# Patient Record
Sex: Male | Born: 1990 | Race: Black or African American | Hispanic: No | Marital: Single | Smoking: Current every day smoker
Health system: Southern US, Community
[De-identification: ages and names within clinical notes are randomized; demographics above are authoritative.]

## PROBLEM LIST (undated history)

## (undated) DIAGNOSIS — I10 Essential (primary) hypertension: Secondary | ICD-10-CM

---
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
## 2008-04-09 ENCOUNTER — Encounter: Payer: Self-pay | Admitting: Family Medicine

## 2008-04-29 ENCOUNTER — Encounter: Payer: Self-pay | Admitting: Family Medicine

## 2008-06-04 ENCOUNTER — Encounter: Payer: Self-pay | Admitting: Family Medicine

## 2009-02-10 ENCOUNTER — Encounter: Payer: Self-pay | Admitting: Family Medicine

## 2009-02-10 ENCOUNTER — Ambulatory Visit: Payer: Self-pay | Admitting: Family Medicine

## 2009-02-10 DIAGNOSIS — D573 Sickle-cell trait: Secondary | ICD-10-CM | POA: Insufficient documentation

## 2009-02-10 LAB — CONVERTED CEMR LAB
ALT: 9 units/L (ref 0–53)
AST: 12 units/L (ref 0–37)
Albumin: 4.8 g/dL (ref 3.5–5.2)
Alkaline Phosphatase: 65 units/L (ref 39–117)
BUN: 14 mg/dL (ref 6–23)
CO2: 24 meq/L (ref 19–32)
Calcium: 9.6 mg/dL (ref 8.4–10.5)
Chloride: 105 meq/L (ref 96–112)
Creatinine, Ser: 1.24 mg/dL (ref 0.40–1.50)
Glucose, Bld: 95 mg/dL (ref 70–99)
HCT: 43.9 % (ref 39.0–52.0)
Hemoglobin: 15.5 g/dL (ref 13.0–17.0)
MCHC: 35.3 g/dL (ref 30.0–36.0)
MCV: 84.1 fL (ref 78.0–100.0)
Platelets: 155 10*3/uL (ref 150–400)
Potassium: 4.8 meq/L (ref 3.5–5.3)
RBC: 5.22 M/uL (ref 4.22–5.81)
RDW: 15 % (ref 11.5–15.5)
Sodium: 141 meq/L (ref 135–145)
Total Bilirubin: 0.7 mg/dL (ref 0.3–1.2)
Total Protein: 8.1 g/dL (ref 6.0–8.3)
WBC: 4.3 10*3/uL (ref 4.0–10.5)

## 2009-02-16 ENCOUNTER — Encounter (INDEPENDENT_AMBULATORY_CARE_PROVIDER_SITE_OTHER): Payer: Self-pay | Admitting: *Deleted

## 2009-02-20 ENCOUNTER — Ambulatory Visit: Payer: Self-pay | Admitting: Family Medicine

## 2009-05-03 ENCOUNTER — Emergency Department (HOSPITAL_COMMUNITY): Admission: EM | Admit: 2009-05-03 | Discharge: 2009-05-03 | Payer: Self-pay | Admitting: Emergency Medicine

## 2009-08-15 ENCOUNTER — Encounter (INDEPENDENT_AMBULATORY_CARE_PROVIDER_SITE_OTHER): Payer: Self-pay | Admitting: *Deleted

## 2009-08-15 DIAGNOSIS — F17200 Nicotine dependence, unspecified, uncomplicated: Secondary | ICD-10-CM | POA: Insufficient documentation

## 2010-03-29 ENCOUNTER — Ambulatory Visit: Payer: Self-pay | Admitting: Family Medicine

## 2010-03-29 ENCOUNTER — Encounter: Payer: Self-pay | Admitting: Family Medicine

## 2010-03-29 ENCOUNTER — Telehealth: Payer: Self-pay | Admitting: Family Medicine

## 2010-03-29 DIAGNOSIS — J029 Acute pharyngitis, unspecified: Secondary | ICD-10-CM

## 2010-03-29 LAB — CONVERTED CEMR LAB: Rapid Strep: NEGATIVE

## 2010-03-30 ENCOUNTER — Encounter: Payer: Self-pay | Admitting: Family Medicine

## 2010-12-08 ENCOUNTER — Emergency Department (HOSPITAL_COMMUNITY)
Admission: EM | Admit: 2010-12-08 | Discharge: 2010-12-08 | Payer: Self-pay | Source: Home / Self Care | Admitting: Emergency Medicine

## 2010-12-27 ENCOUNTER — Ambulatory Visit
Admission: RE | Admit: 2010-12-27 | Discharge: 2010-12-27 | Payer: Self-pay | Source: Home / Self Care | Attending: Family Medicine | Admitting: Family Medicine

## 2010-12-28 ENCOUNTER — Ambulatory Visit: Admission: RE | Admit: 2010-12-28 | Payer: Self-pay | Source: Home / Self Care

## 2010-12-28 ENCOUNTER — Telehealth: Payer: Self-pay | Admitting: *Deleted

## 2010-12-28 NOTE — Letter (Signed)
Summary: Generic Letter  [facility]  [address]   [place], [place] [address]   Phone: [phone]  Fax: [phone]    03/30/2010  Walter Salinas [address] [place], [place]  [address]  Dear Mr. Deloria,   Your HIV test was negative    Sincerely,   Angelena Sole MD  Appended Document: Generic Letter mailed.

## 2010-12-28 NOTE — Assessment & Plan Note (Signed)
Summary: sore throat/[place]   Vital Signs:  Patient profile:   20 year old male Height:      71 inches Weight:      157 pounds BMI:     21.98 BSA:     1.90 Temp:     98.1 degrees F Pulse rate:   87 / minute BP sitting:   151 / 102  Vitals Entered By: Jone Baseman CMA (Mar 29, 2010 1:58 PM)  Serial Vital Signs/Assessments:  Time      Position  BP       Pulse  Resp  Temp     By                     140/100                        Angelena Sole MD  CC: sore throat x 1 day Is Patient Diabetic? No Pain Assessment Patient in pain? yes     Location: throat Intensity: 8   Primary Care Provider:  Neena Rhymes  MD  CC:  sore throat x 1 day.  History of Present Illness: 1. Sore throat:  Pt has had a sore throat since Saturday.  It came on all of a sudden after he was having sex with a girl.  It feels like a tickle in his throat.  It is associated with a cough.  It is improved with cough syrup.    ROS: denies fevers, lymphadenopathy, abdominal pain, shortness of breath.  SocHx: Does do oral sex.  Uses a condom during intercourse.  Continues to smoke tobacco.  2. HTN: pt has not been taking his Norvasc.  Hasn't taken anything in years.  He doesn't check his blood pressure at home regularly.  ROS: endorses occ dizziness.  Denies chest pain, headache.  Habits & Providers  Alcohol-Tobacco-Diet     Tobacco Status: current     Tobacco Counseling: to quit use of tobacco products     Cigarette Packs/Day: 1.0  Current Medications (verified): 1)  Ibuprofen 600 Mg  Tabs (Ibuprofen) .Marland Kitchen.. 1 Tablet Every 6 To 8 Hours Daiy Prn (No More Than 4 Tablets in 24 Hours) 2)  Zyrtec Allergy 10 Mg  Tabs (Cetirizine Hcl) .Marland Kitchen.. 1 Once Daily Prn 3)  Flonase 50 Mcg/act Susp (Fluticasone Propionate) .... 2 Sprays Per Nostril Daily. 4)  Lisinopril-Hydrochlorothiazide 10-12.5 Mg Tabs (Lisinopril-Hydrochlorothiazide) .... Take 1 Tab By Mouth Daily  Allergies: No Known Drug Allergies  Past  History:  Past Medical History: Reviewed history from 10/31/2007 and no changes required. 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 mom: asthma father: Hypertension, DMII, on dialysis, some cancer  Social History: Reviewed history from 02/10/2009 and no changes required. lives with mom and older sister, favors fried food - high salt diet.  current smoker.Packs/Day:  1.0  Physical Exam  General:  alert, well-developed, and well-hydrated.   Head:  normocephalic and atraumatic.   Eyes:  vision grossly intact.   Mouth:  poor dentition, pharynx red and moist.  No definite exudate but poor visibility.   Neck:  anterior cervical lymphadenopathy Lungs:  normal respiratory effort and normal breath sounds.  Occassional expiratory wheezes.  No crackles.   Heart:  normal rate, regular rhythm, and no murmur.   Abdomen:  soft, non-tender, normal bowel sounds, and no distention.   Msk:  no  joint tenderness and no joint swelling.   Extremities:  no lower extremity edema Skin:  turgor normal, no rashes, and no suspicious lesions.     Impression & Recommendations:  Problem # 1:  SORE THROAT (ICD-462) Assessment New Likely viral.  Rapid strep negative.  He does have some risk factors for GC pharyngitis or mono.  If symptoms persist would check for these. His updated medication list for this problem includes:    Ibuprofen 600 Mg Tabs (Ibuprofen) .Marland Kitchen... 1 tablet every 6 to 8 hours daiy prn (no more than 4 tablets in 24 hours)  Orders: Rapid Strep-[facility] (62130) [facility]- Est Level  3 (86578)  Problem # 2:  HYPERTENSION, BENIGN SYSTEMIC (ICD-401.1) Assessment: Deteriorated  Not taking his medications.  Encouraged him about the importance of him taking his medicine.  Wills start ACEI-HCTZ. The following medications were removed from the medication list:    Norvasc 5 Mg Tabs  (Amlodipine besylate) .Marland Kitchen... 1 tab by mouth daily His updated medication list for this problem includes:    Lisinopril-hydrochlorothiazide 10-12.5 Mg Tabs (Lisinopril-hydrochlorothiazide) .Marland Kitchen... Take 1 tab by mouth daily  Orders: [facility]- Est Level  3 (46962)  Complete Medication List: 1)  Ibuprofen 600 Mg Tabs (Ibuprofen) .Marland Kitchen.. 1 tablet every 6 to 8 hours daiy prn (no more than 4 tablets in 24 hours) 2)  Zyrtec Allergy 10 Mg Tabs (Cetirizine hcl) .Marland Kitchen.. 1 once daily prn 3)  Flonase 50 Mcg/act Susp (Fluticasone propionate) .... 2 sprays per nostril daily. 4)  Lisinopril-hydrochlorothiazide 10-12.5 Mg Tabs (Lisinopril-hydrochlorothiazide) .... Take 1 tab by mouth daily  Other Orders: HIV-[facility] ([id number])  Patient Instructions: 1)  We are going to check some labs today 2)  I think that your sore throat is caused by a virus 3)  It should get better in 7-10 days 4)  If not better by then please return to clinic 5)  If you develop fevers, difficulty swallowing or shortness of breath please return to clinic. 6)  I have sent a prescription for a blood pressure medicine to the pharmacy. 7)  It is important for you to take this to prevent a heart attack or stroke 8)  Please schedule a follow up appointment with me in 6-8 weeks Prescriptions: LISINOPRIL-HYDROCHLOROTHIAZIDE 10-12.5 MG TABS (LISINOPRIL-HYDROCHLOROTHIAZIDE) Take 1 tab by mouth daily  #30 x 3   Entered and Authorized by:   Angelena Sole MD   Signed by:   Angelena Sole MD on 03/29/2010   Method used:   Electronically to        [id number]       [address]       [place], [place]  [address]       Ph: [phone] or [phone]       Fax: [phone]   RxID:   [id number]   Laboratory Results  Date/Time Received: Mar 29, 2010 2:19 PM  Date/Time Reported: Mar 29, 2010 2:44 PM   Other Tests  Rapid Strep: negative Comments: ...........test performed by...........Marland KitchenTerese Door,  CMA     Prevention & Chronic Care Immunizations   Influenza vaccine: Not documented    Tetanus booster: Not documented    Pneumococcal vaccine: Not documented  Other Screening   Smoking status: current  (03/29/2010)   Smoking cessation counseling: yes  (03/29/2010)  Hypertension   Last Blood Pressure: 151 / 102  (03/29/2010)   Serum creatinine: 1.24  (02/10/2009)   Serum potassium 4.8  (  02/10/2009)    Hypertension flowsheet reviewed?: Yes   Progress toward BP goal: Deteriorated  Self-Management Support :   Personal Goals (by the next clinic visit) :      Personal blood pressure goal: 140/90  (03/29/2010)   Hypertension self-management support: Not documented

## 2010-12-28 NOTE — Progress Notes (Signed)
Summary: triage  Phone Note Call from Patient Call back at [phone]   Caller: Adrienne Mocha Summary of Call: Pt has sore throat and wondering if he can be seen today. Initial call taken by: Clydell Hakim,  Mar 29, 2010 10:19 AM  Follow-up for Phone Call        she will bring him at 1:30 & will see Dr. Lelon Perla Follow-up by: Golden Circle RN,  Mar 29, 2010 10:22 AM

## 2010-12-30 ENCOUNTER — Emergency Department (HOSPITAL_COMMUNITY)
Admission: EM | Admit: 2010-12-30 | Discharge: 2010-12-30 | Disposition: A | Discharge status: Home / Self Care | Payer: Self-pay | Attending: Emergency Medicine | Admitting: Emergency Medicine

## 2010-12-30 DIAGNOSIS — I10 Essential (primary) hypertension: Secondary | ICD-10-CM | POA: Insufficient documentation

## 2010-12-30 DIAGNOSIS — N342 Other urethritis: Secondary | ICD-10-CM | POA: Insufficient documentation

## 2010-12-30 DIAGNOSIS — R300 Dysuria: Secondary | ICD-10-CM | POA: Insufficient documentation

## 2010-12-30 LAB — URINE MICROSCOPIC-ADD ON

## 2010-12-30 LAB — URINALYSIS, ROUTINE W REFLEX MICROSCOPIC
Bilirubin Urine: NEGATIVE
Hgb urine dipstick: NEGATIVE
Ketones, ur: NEGATIVE mg/dL
Nitrite: NEGATIVE
Protein, ur: NEGATIVE mg/dL
Specific Gravity, Urine: 1.018 (ref 1.005–1.030)
Urine Glucose, Fasting: NEGATIVE mg/dL
Urobilinogen, UA: 4 mg/dL — ABNORMAL HIGH (ref 0.0–1.0)
pH: 7.5 (ref 5.0–8.0)

## 2010-12-31 LAB — GC/CHLAMYDIA PROBE AMP, GENITAL
Chlamydia, DNA Probe: NEGATIVE
GC Probe Amp, Genital: NEGATIVE

## 2011-01-05 NOTE — Progress Notes (Signed)
Summary: Complaint  Phone Note Call from Patient Call back at [phone]   Caller: Amy/Mother Summary of Call: Mother requesting to speak with Director re: todays appt, pt ended up lvg without being seen.  Initial call taken by: Knox Royalty,  December 28, 2010 2:25 PM  Follow-up for Phone Call        Call was from his Aunt.  She described his concern regarding his office visit today.  Asked for his phone number so I could speak with him directly.   Follow-up by: Dennison Nancy RN,  December 28, 2010 4:22 PM  Additional Follow-up for Phone Call Additional follow up Details #1::        Spoke with patient and clarified the circumstances regarding his concerns.  I apologized and assured him I would follow up.  Asked him if he wanted me to call him back after speaking with the employee and he said "no" .   Additional Follow-up by: Dennison Nancy RN,  December 29, 2010 7:51 AM

## 2011-01-13 ENCOUNTER — Ambulatory Visit: Payer: Self-pay | Admitting: Family Medicine

## 2011-01-18 ENCOUNTER — Ambulatory Visit (INDEPENDENT_AMBULATORY_CARE_PROVIDER_SITE_OTHER): Payer: Self-pay | Admitting: Family Medicine

## 2011-01-18 ENCOUNTER — Other Ambulatory Visit: Payer: Self-pay | Admitting: Family Medicine

## 2011-01-18 ENCOUNTER — Encounter: Payer: Self-pay | Admitting: Family Medicine

## 2011-01-18 VITALS — BP 151/101 | HR 69 | Temp 97.9°F | Ht 70.0 in | Wt 164.3 lb

## 2011-01-18 DIAGNOSIS — Z202 Contact with and (suspected) exposure to infections with a predominantly sexual mode of transmission: Secondary | ICD-10-CM | POA: Insufficient documentation

## 2011-01-18 DIAGNOSIS — B86 Scabies: Secondary | ICD-10-CM

## 2011-01-18 LAB — CONVERTED CEMR LAB
Chlamydia, Swab/Urine, PCR: NEGATIVE
GC Probe Amp, Urine: NEGATIVE

## 2011-01-18 MED ORDER — PERMETHRIN 5 % EX CREA: TOPICAL_CREAM | CUTANEOUS | Status: DC

## 2011-01-18 NOTE — Patient Instructions (Signed)
Scabies   Scabies are small bugs (mites) that burrow under the skin and cause red bumps and severe itching. These bugs can only be seen with a microscope.   Scabies are highly contagious. They can spread easily from person to person by direct contact. They are also spread through sharing clothing or linens that have the scabies mites living in them. It is not unusual for an entire family to become infected through shared towels, clothing, or bedding.   HOME CARE INSTRUCTIONS   Your caregiver may prescribe a cream or lotion to kill the mites. If this cream is prescribed; massage the cream into the entire area of the body from the neck to the bottom of both feet. Also massage the cream into the scalp and face if your child is less than 1 year old. Avoid the eyes and mouth.   Leave the cream on for 8 to12 hours. DO NOT wash your hands after application. Your child should bathe or shower after the 8 to 12 hour application period. Sometimes it is helpful to apply the cream to your child at right before bedtime.   One treatment is usually effective and will eliminate approximately 95% of infestations. For severe cases, your caregiver may decide to repeat the treatment in 1 week. Everyone in your household should be treated with one application of the cream.   New rashes or burrows should not appear after successful treatment within 24 to 48 hours; however the itching and rash may last for 2 to 4 weeks after successful treatment. If your symptoms persist longer than this, see your caregiver.   Your caregiver also may prescribe a medication to help with the itching or to help the rash go away more quickly.   Scabies can live on clothing or linens for up to 3 days. Your entire child’s recently used clothing, towels, stuffed toys, and bed linens should be washed in hot water and then dried in a dryer for at least 20 minutes on high heat. Items that cannot be washed should be enclosed in a plastic bag for at least 3 days.   To  help relieve itching, bathe your child in a COOL bath or apply cool washcloths to the affected areas.   Your child may return to school after treatment with the prescribed cream.   SEEK MEDICAL CARE IF:   The itching persists longer than 4 weeks after treatment.   The rash spreads or becomes infected (the area has red blisters or yellow-tan crust).   Document Released: 11/14/2005 Document Re-Released: 04/02/2009   ExitCare® Patient Information ©2011 [employer].

## 2011-01-18 NOTE — Progress Notes (Signed)
  Subjective:    Patient ID: Walter Salinas, male    DOB: Apr 19, 1991, 20 y.o.   MRN: [id number]  Rash This is a new problem. The current episode started 1 to 4 weeks ago. The problem has been gradually worsening since onset. The affected locations include the left hand, right hand, left axilla, right axilla and groin. The rash is characterized by itchiness. Associated with: scabies. Pertinent negatives include no anorexia or cough. Past treatments include nothing.      Review of Systems  Respiratory: Negative for cough.   Gastrointestinal: Negative for anorexia.  Skin: Positive for rash.       Objective:   Physical Exam  Constitutional: He appears well-developed and well-nourished.  Eyes: Conjunctivae are normal.  Cardiovascular: Normal rate and regular rhythm.   Pulmonary/Chest: Effort normal and breath sounds normal.  Skin:       Rash in between fingers, axilla, and groin.  Burrowing seen c/w scabies.          Assessment & Plan:

## 2011-01-18 NOTE — Assessment & Plan Note (Signed)
Rash c/w scabies.  Treat with Permethrin.

## 2011-01-18 NOTE — Assessment & Plan Note (Signed)
Check GC/Chlamydia.  Pt refused HIV/RPR testing

## 2011-01-19 LAB — GC/CHLAMYDIA PROBE AMP, URINE
Chlamydia, Swab/Urine, PCR: NEGATIVE
GC Probe Amp, Urine: NEGATIVE

## 2011-01-20 ENCOUNTER — Encounter: Payer: Self-pay | Admitting: Family Medicine

## 2011-03-10 ENCOUNTER — Emergency Department (HOSPITAL_COMMUNITY)
Admission: EM | Admit: 2011-03-10 | Discharge: 2011-03-10 | Disposition: A | Discharge status: Home / Self Care | Payer: Self-pay | Attending: Emergency Medicine | Admitting: Emergency Medicine

## 2011-03-10 DIAGNOSIS — R630 Anorexia: Secondary | ICD-10-CM | POA: Insufficient documentation

## 2011-03-10 DIAGNOSIS — I10 Essential (primary) hypertension: Secondary | ICD-10-CM | POA: Insufficient documentation

## 2011-03-10 DIAGNOSIS — R599 Enlarged lymph nodes, unspecified: Secondary | ICD-10-CM | POA: Insufficient documentation

## 2011-03-10 DIAGNOSIS — R509 Fever, unspecified: Secondary | ICD-10-CM | POA: Insufficient documentation

## 2011-03-10 DIAGNOSIS — J029 Acute pharyngitis, unspecified: Secondary | ICD-10-CM | POA: Insufficient documentation

## 2011-03-10 DIAGNOSIS — R1310 Dysphagia, unspecified: Secondary | ICD-10-CM | POA: Insufficient documentation

## 2011-03-10 DIAGNOSIS — IMO0001 Reserved for inherently not codable concepts without codable children: Secondary | ICD-10-CM | POA: Insufficient documentation

## 2011-03-10 LAB — RAPID STREP SCREEN (MED CTR MEBANE ONLY): Streptococcus, Group A Screen (Direct): NEGATIVE

## 2011-03-12 ENCOUNTER — Emergency Department (HOSPITAL_COMMUNITY)
Admission: EM | Admit: 2011-03-12 | Discharge: 2011-03-13 | Disposition: A | Discharge status: Home / Self Care | Payer: Self-pay | Attending: Emergency Medicine | Admitting: Emergency Medicine

## 2011-03-12 DIAGNOSIS — I10 Essential (primary) hypertension: Secondary | ICD-10-CM | POA: Insufficient documentation

## 2011-03-12 DIAGNOSIS — J351 Hypertrophy of tonsils: Secondary | ICD-10-CM | POA: Insufficient documentation

## 2011-03-12 DIAGNOSIS — J029 Acute pharyngitis, unspecified: Secondary | ICD-10-CM | POA: Insufficient documentation

## 2011-03-12 DIAGNOSIS — R1310 Dysphagia, unspecified: Secondary | ICD-10-CM | POA: Insufficient documentation

## 2011-03-12 DIAGNOSIS — R599 Enlarged lymph nodes, unspecified: Secondary | ICD-10-CM | POA: Insufficient documentation

## 2011-03-12 DIAGNOSIS — R509 Fever, unspecified: Secondary | ICD-10-CM | POA: Insufficient documentation

## 2011-03-13 LAB — MONONUCLEOSIS SCREEN: Mono Screen: NEGATIVE

## 2011-06-26 ENCOUNTER — Emergency Department (HOSPITAL_COMMUNITY)
Admission: EM | Admit: 2011-06-26 | Discharge: 2011-06-26 | Disposition: A | Discharge status: Home / Self Care | Payer: Self-pay | Attending: Emergency Medicine | Admitting: Emergency Medicine

## 2011-06-26 DIAGNOSIS — I10 Essential (primary) hypertension: Secondary | ICD-10-CM | POA: Insufficient documentation

## 2011-06-26 DIAGNOSIS — R221 Localized swelling, mass and lump, neck: Secondary | ICD-10-CM | POA: Insufficient documentation

## 2011-06-26 DIAGNOSIS — R220 Localized swelling, mass and lump, head: Secondary | ICD-10-CM | POA: Insufficient documentation

## 2011-06-26 DIAGNOSIS — J029 Acute pharyngitis, unspecified: Secondary | ICD-10-CM | POA: Insufficient documentation

## 2011-06-26 DIAGNOSIS — R1310 Dysphagia, unspecified: Secondary | ICD-10-CM | POA: Insufficient documentation

## 2011-06-26 DIAGNOSIS — R599 Enlarged lymph nodes, unspecified: Secondary | ICD-10-CM | POA: Insufficient documentation

## 2011-09-02 LAB — INFLUENZA A+B VIRUS AG-DIRECT(RAPID)
Inflenza A Ag: NEGATIVE
Influenza B Ag: NEGATIVE

## 2011-09-02 LAB — RAPID STREP SCREEN (MED CTR MEBANE ONLY): Streptococcus, Group A Screen (Direct): NEGATIVE

## 2011-09-02 LAB — URINE MICROSCOPIC-ADD ON

## 2011-09-02 LAB — URINALYSIS, ROUTINE W REFLEX MICROSCOPIC
Bilirubin Urine: NEGATIVE
Glucose, UA: NEGATIVE
Ketones, ur: NEGATIVE
Leukocytes, UA: NEGATIVE
Nitrite: NEGATIVE
Protein, ur: NEGATIVE
Specific Gravity, Urine: 1.018
Urobilinogen, UA: 1
pH: 6.5

## 2011-09-02 LAB — GC/CHLAMYDIA PROBE AMP, URINE
Chlamydia, Swab/Urine, PCR: NEGATIVE
GC Probe Amp, Urine: NEGATIVE

## 2011-11-25 ENCOUNTER — Encounter (HOSPITAL_COMMUNITY): Payer: Self-pay | Admitting: Emergency Medicine

## 2011-11-25 ENCOUNTER — Emergency Department (HOSPITAL_COMMUNITY)
Admission: EM | Admit: 2011-11-25 | Discharge: 2011-11-25 | Discharge status: Against Medical Advice | Payer: Self-pay | Attending: Emergency Medicine | Admitting: Emergency Medicine

## 2011-11-25 DIAGNOSIS — M25559 Pain in unspecified hip: Secondary | ICD-10-CM | POA: Insufficient documentation

## 2011-11-25 HISTORY — DX: Essential (primary) hypertension: I10
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
## 2012-05-21 ENCOUNTER — Encounter (HOSPITAL_COMMUNITY): Payer: Self-pay | Admitting: *Deleted

## 2012-05-21 ENCOUNTER — Emergency Department (HOSPITAL_COMMUNITY)
Admission: EM | Admit: 2012-05-21 | Discharge: 2012-05-21 | Disposition: A | Discharge status: Home / Self Care | Payer: Self-pay | Attending: Emergency Medicine | Admitting: Emergency Medicine

## 2012-05-21 DIAGNOSIS — S51809A Unspecified open wound of unspecified forearm, initial encounter: Secondary | ICD-10-CM | POA: Insufficient documentation

## 2012-05-21 DIAGNOSIS — F17200 Nicotine dependence, unspecified, uncomplicated: Secondary | ICD-10-CM | POA: Insufficient documentation

## 2012-05-21 DIAGNOSIS — IMO0002 Reserved for concepts with insufficient information to code with codable children: Secondary | ICD-10-CM

## 2012-05-21 DIAGNOSIS — I10 Essential (primary) hypertension: Secondary | ICD-10-CM | POA: Insufficient documentation

## 2012-05-21 MED ORDER — OXYCODONE-ACETAMINOPHEN 5-325 MG PO TABS
2.0000 | ORAL_TABLET | Freq: Once | ORAL | Status: AC
  Administered 2012-05-21: 2 via ORAL
  Filled 2012-05-21: qty 2

## 2012-05-21 MED ORDER — IBUPROFEN 600 MG PO TABS: 600.0000 mg | ORAL_TABLET | Freq: Four times a day (QID) | ORAL | Status: AC | PRN

## 2012-05-21 MED ORDER — LIDOCAINE HCL (PF) 1 % IJ SOLN
5.0000 mL | Freq: Once | INTRAMUSCULAR | Status: AC
  Administered 2012-05-21: 5 mL via INTRADERMAL
  Filled 2012-05-21: qty 5

## 2012-05-21 MED ORDER — CEFAZOLIN SODIUM 1 G IJ SOLR
1.0000 g | Freq: Once | INTRAMUSCULAR | Status: AC
  Administered 2012-05-21: 1 g via INTRAMUSCULAR
  Filled 2012-05-21: qty 10

## 2012-05-21 MED ORDER — OXYCODONE-ACETAMINOPHEN 5-325 MG PO TABS: 1.0000 | ORAL_TABLET | ORAL | Status: AC | PRN

## 2012-05-21 MED ORDER — CEPHALEXIN 500 MG PO CAPS: 500.0000 mg | ORAL_CAPSULE | Freq: Four times a day (QID) | ORAL | Status: AC

## 2012-05-21 MED ORDER — AMLODIPINE BESYLATE 5 MG PO TABS: 10.0000 mg | ORAL_TABLET | Freq: Every day | ORAL | Status: DC

## 2012-05-21 NOTE — Discharge Instructions (Signed)
Laceration Care, Adult A laceration is a cut that goes through all layers of the skin. The cut goes into the tissue beneath the skin. HOME CARE For stitches (sutures) or staples:  Keep the cut clean and dry.   If you have a bandage (dressing), change it at least once a day. Change the bandage if it gets wet or dirty, or as told by your doctor.   Wash the cut with soap and water 2 times a day. Rinse the cut with water. Pat it dry with a clean towel.   Put a thin layer of medicated cream on the cut as told by your doctor.   You may shower after the first 24 hours. Do not soak the cut in water until the stitches are removed.   Only take medicines as told by your doctor.   Have your stitches or staples removed as told by your doctor.  For skin adhesive strips:  Keep the cut clean and dry.   Do not get the strips wet. You may take a bath, but be careful to keep the cut dry.   If the cut gets wet, pat it dry with a clean towel.   The strips will fall off on their own. Do not remove the strips that are still stuck to the cut.  For wound glue:  You may shower or take baths. Do not soak or scrub the cut. Do not swim. Avoid heavy sweating until the glue falls off on its own. After a shower or bath, pat the cut dry with a clean towel.   Do not put medicine on your cut until the glue falls off.   If you have a bandage, do not put tape over the glue.   Avoid lots of sunlight or tanning lamps until the glue falls off. Put sunscreen on the cut for the first year to reduce your scar.   The glue will fall off on its own. Do not pick at the glue.  You may need a tetanus shot if:  You cannot remember when you had your last tetanus shot.   You have never had a tetanus shot.  If you need a tetanus shot and you choose not to have one, you may get tetanus. Sickness from tetanus can be serious. GET HELP RIGHT AWAY IF:   Your pain does not get better with medicine.   Your arm, hand, leg, or  foot loses feeling (numbness) or changes color.   Your cut is bleeding.   Your joint feels weak, or you cannot use your joint.   You have painful lumps on your body.   Your cut is red, puffy (swollen), or painful.   You have a red line on the skin near the cut.   You have yellowish-white fluid (pus) coming from the cut.   You have a fever.   You have a bad smell coming from the cut or bandage.   Your cut breaks open before or after stitches are removed.   You notice something coming out of the cut, such as wood or glass.   You cannot move a finger or toe.  MAKE SURE YOU:   Understand these instructions.   Will watch your condition.   Will get help right away if you are not doing well or get worse.  Document Released: 05/02/2008 Document Revised: 11/03/2011 Document Reviewed: 05/10/2011 [facility] Patient Information 2012 [place], [place].  RESOURCE GUIDE  Dental Problems  Patients with Medicaid: [facility]  [facility] [address]                                           [phone] [address] [employer] Phone:  [phone]                                                   Phone:  [phone]  If unable to pay or uninsured, contact:  [facility] or [facility]. to become qualified for the adult dental clinic.  Chronic Pain Problems Contact [facility]  [phone] Patients need to be referred by their primary care doctor.  Insufficient Money for Medicine Contact [employer]:  call "211" or [facility] 337-027-8116.  No Primary Care Doctor Call [facility]  [phone] Other agencies that provide inexpensive medical care    [facility]  [phone]    [facility]  [phone]    [facility]  [phone]    [facility]  [phone]    [facility]  [phone]    [facility]  [phone]  Psychological Services [facility]   [phone] [facility]  [phone] [facility]   [phone] (emergency services [phone])  Abuse/Neglect [facility] Child Abuse Hotline [phone] [facility] Child Abuse Hotline 551-216-7046 (After Hours)  Emergency Shelter [facility] [phone]  [facility] 254-501-3591 [facility] [phone]  MRSA Hotline #:   579-373-8061    [facility] Resources  [facility]  [employer]                           [facility] Dept. [address]                     [address]         [address] [place] [address]  Blondell Reveal Phone:  [phone]                                  Phone:  [phone]                   Phone:  [phone]  [facility] Phone:  [phone]  [facility] Child Abuse Hotline [phone] (After Hours)
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
## 2012-05-21 NOTE — ED Notes (Signed)
Password is: [email or web address]  Mother is the only person pt states that will know pw

## 2012-12-05 ENCOUNTER — Encounter (HOSPITAL_COMMUNITY): Payer: Self-pay | Admitting: *Deleted

## 2012-12-05 ENCOUNTER — Emergency Department (HOSPITAL_COMMUNITY)
Admission: EM | Admit: 2012-12-05 | Discharge: 2012-12-05 | Disposition: A | Discharge status: Home / Self Care | Payer: Self-pay | Attending: Emergency Medicine | Admitting: Emergency Medicine

## 2012-12-05 DIAGNOSIS — I10 Essential (primary) hypertension: Secondary | ICD-10-CM | POA: Insufficient documentation

## 2012-12-05 DIAGNOSIS — F17200 Nicotine dependence, unspecified, uncomplicated: Secondary | ICD-10-CM | POA: Insufficient documentation

## 2012-12-05 DIAGNOSIS — B86 Scabies: Secondary | ICD-10-CM | POA: Insufficient documentation

## 2012-12-05 MED ORDER — PERMETHRIN 5 % EX CREA: TOPICAL_CREAM | CUTANEOUS | Status: DC

## 2012-12-05 NOTE — ED Provider Notes (Signed)
History   This chart was scribed for non-physician practitioner working with Juliet Rude. Rubin Payor, MD by Gerlean Ren, ED Scribe. This patient was seen in room TR05C/TR05C and the patient's care was started at 7:14 PM.    CSN: [id number]  Arrival date & time 12/05/12  1725   First MD Initiated Contact with Patient 12/05/12 1833      Chief Complaint  Patient presents with  . Rash     The history is provided by the patient. No language interpreter was used.   Walter Salinas is a 22 y.o. male with h/o HTN who presents to the Emergency Department complaining of an itching rash that began on buttocks and has since moved to bilateral upper thighs, genitals, lower back and most recently noticed between fingers.  Pt reports he was recently released from jail and that there was a scabies outbreak there.  Pt is a current everyday smoker and reports occasional alcohol use. Has not tried anything to alleviate his symptoms.  He states that the bumps are very itchy.  Past Medical History  Diagnosis Date  . Hypertension     History reviewed. No pertinent past surgical history.  No family history on file.  History  Substance Use Topics  . Smoking status: Current Every Day Smoker -- 1.0 packs/day    Types: Cigarettes  . Smokeless tobacco: Not on file  . Alcohol Use: Yes     Comment: Sometimes      Review of Systems  Skin: Positive for rash.    Allergies  Review of patient's allergies indicates no known allergies.  Home Medications  No current outpatient prescriptions on file.  BP 148/97  Pulse 87  Temp 98.2 F (36.8 C) (Oral)  Resp 16  SpO2 97%  Physical Exam  Nursing note and vitals reviewed. Constitutional: He is oriented to person, place, and time. He appears well-developed and well-nourished. No distress.  HENT:  Head: Normocephalic and atraumatic.  Eyes: EOM are normal.  Neck: Neck supple. No tracheal deviation present.  Cardiovascular: Normal rate.     Pulmonary/Chest: Effort normal. No respiratory distress.  Abdominal: He exhibits no distension.  Musculoskeletal: Normal range of motion.  Neurological: He is alert and oriented to person, place, and time.  Skin: Skin is warm and dry.       Diffuse papules indicative of scabies  Psychiatric: He has a normal mood and affect. His behavior is normal.    ED Course  Procedures (including critical care time) DIAGNOSTIC STUDIES: Oxygen Saturation is 97% on room air, adequate by my interpretation.    COORDINATION OF CARE: 7:17 PM- Patient informed of clinical course, understands medical decision-making process, and agrees with plan.    Labs Reviewed - No data to display No results found.   1. Scabies       MDM  22 year old male with scabies. Will treat with permethrin. Instructed the patient on how to use the permethrin, also encouraged him to wash his linens in hot water twice. Patient understands and agrees with the plan. He is stable and ready for discharge.  I personally performed the services described in this documentation, which was scribed in my presence. The recorded information has been reviewed and is accurate.          Roxy Horseman, PA-C 12/05/12 1930

## 2012-12-05 NOTE — ED Provider Notes (Signed)
Medical screening examination/treatment/procedure(s) were performed by non-physician practitioner and as supervising physician I was immediately available for consultation/collaboration.  Cadance Raus R. Johni Narine, MD 12/05/12 2331 
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
## 2012-12-05 NOTE — ED Notes (Signed)
Pt c/o itching at rash sites: between fingers, bilateral arms, lower back, bilateral legs, buttocks, and groin. A&Ox4, ambulatory, nad.

## 2013-04-20 ENCOUNTER — Emergency Department (HOSPITAL_COMMUNITY)
Admission: EM | Admit: 2013-04-20 | Discharge: 2013-04-20 | Disposition: A | Discharge status: Home / Self Care | Payer: Self-pay | Attending: Emergency Medicine | Admitting: Emergency Medicine

## 2013-04-20 ENCOUNTER — Encounter (HOSPITAL_COMMUNITY): Payer: Self-pay | Admitting: *Deleted

## 2013-04-20 DIAGNOSIS — Z9119 Patient's noncompliance with other medical treatment and regimen: Secondary | ICD-10-CM | POA: Insufficient documentation

## 2013-04-20 DIAGNOSIS — J069 Acute upper respiratory infection, unspecified: Secondary | ICD-10-CM | POA: Insufficient documentation

## 2013-04-20 DIAGNOSIS — R05 Cough: Secondary | ICD-10-CM | POA: Insufficient documentation

## 2013-04-20 DIAGNOSIS — Z91199 Patient's noncompliance with other medical treatment and regimen due to unspecified reason: Secondary | ICD-10-CM | POA: Insufficient documentation

## 2013-04-20 DIAGNOSIS — R059 Cough, unspecified: Secondary | ICD-10-CM | POA: Insufficient documentation

## 2013-04-20 DIAGNOSIS — I10 Essential (primary) hypertension: Secondary | ICD-10-CM | POA: Insufficient documentation

## 2013-04-20 DIAGNOSIS — F17200 Nicotine dependence, unspecified, uncomplicated: Secondary | ICD-10-CM | POA: Insufficient documentation

## 2013-04-20 MED ORDER — OXYCODONE-ACETAMINOPHEN 5-325 MG PO TABS
1.0000 | ORAL_TABLET | Freq: Once | ORAL | Status: AC
  Administered 2013-04-20: 1 via ORAL
  Filled 2013-04-20: qty 1

## 2013-04-20 MED ORDER — IBUPROFEN 200 MG PO TABS
400.0000 mg | ORAL_TABLET | Freq: Once | ORAL | Status: AC
  Administered 2013-04-20: 400 mg via ORAL
  Filled 2013-04-20: qty 2

## 2013-04-20 NOTE — ED Provider Notes (Signed)
History     CSN: [id number]  Arrival date & time 04/20/13  0209   First MD Initiated Contact with Patient 04/20/13 0445      Chief Complaint  Patient presents with  . Sore Throat    (Consider location/radiation/quality/duration/timing/severity/associated sxs/prior treatment) HPI 22 yo man with history of HTN and medical noncompliance presents with about 10h of nonproductive cough and ST. Pt has ST with coughing only. Burning pain. No SOB or fever. No rinorrhea. No dysphagia. Pain is mild. Only present with coughing. No chest pain. Normal po intake.   Past Medical History  Diagnosis Date  . Hypertension     History reviewed. No pertinent past surgical history.  No family history on file.  History  Substance Use Topics  . Smoking status: Current Every Day Smoker -- 1.00 packs/day    Types: Cigarettes  . Smokeless tobacco: Not on file  . Alcohol Use: Yes     Comment: Sometimes      Review of Systems Gen: no weight loss, fevers, chills, night sweats Eyes: no discharge or drainage, no occular pain or visual changes Nose: no epistaxis or rhinorrhea Mouth: As per history of present illness, otherwise negative Neck: no neck pain Lungs: As per history of present illness, otherwise negative CV: no chest pain, palpitations, dependent edema or orthopnea Abd: no abdominal pain, nausea, vomiting GU: no dysuria or gross hematuria MSK: no myalgias or arthralgias Neuro: no headache, no focal neurologic deficits Skin: no rash Psyche: negative.  Allergies  Review of patient's allergies indicates no known allergies.  Home Medications   Current Outpatient Rx  Name  Route  Sig  Dispense  Refill  . guaifenesin (ROBITUSSIN) 100 MG/5ML syrup   Oral   Take 200 mg by mouth 3 (three) times daily as needed for cough.           BP 165/110  Pulse 78  Temp(Src) 99.2 F (37.3 C) (Oral)  Resp 16  Ht 5\' 11"  (1.803 m)  Wt 165 lb (74.844 kg)  BMI 23.02 kg/m2  SpO2  100%  Physical Exam Gen: well developed and well nourished appearing Head: NCAT Eyes: PERL, EOMI Nose: no epistaixis or rhinorrhea Mouth/throat: mucosa is moist and pink Neck: supple, no stridor, no lan Lungs: CTA B, no wheezing, rhonchi or rales Abd: soft, notender, nondistended Back: no ttp, no cva ttp Skin: no rashese, wnl Neuro: CN ii-xii grossly intact, no focal deficits Psyche; normal affect,  calm and cooperative.   ED Course  Procedures (including critical care time)  Results for orders placed during the hospital encounter of 04/20/13 (from the past 24 hour(s))  RAPID STREP SCREEN     Status: None   Collection Time    04/20/13  2:25 AM      Result Value Range   Streptococcus, Group A Screen (Direct) NEGATIVE  NEGATIVE      MDM  Patient has viral URI sx. I have explained natural hx and plan for symptomatic management. Patient is noted to be persistently hypertensive. He said he was prescribed BP med for the first time at age 30 but took meds for a week and then stopped. Has not had primary care follow up since then.   I have recommended that we restart the patient on an antihypertensive and plan to have him f/u closely in the [facility] clinic. However, I explained to the patient that we would need to check baseline renal function first in light of longstanding and untreated HTN. Patient said this  was fine but, then refused lab draw.   Will refer to [facility] clinic for outpatient f/u.         Brandt Loosen, MD 04/20/13 [phone]

## 2013-04-20 NOTE — ED Notes (Signed)
Pt. Refused labs 

## 2013-04-20 NOTE — ED Notes (Addendum)
C/o sore throat. Throat red, swollen and irritated. Nasal congestion also. sputum blood tinged (1st time here in triage after snorting from nasal sinuses and spitting. Sore throat onset ~ 1 hr ago. Has tried cough medicine.

## 2013-04-22 LAB — CULTURE, GROUP A STREP

## 2013-06-27 ENCOUNTER — Emergency Department (HOSPITAL_COMMUNITY): Payer: Self-pay

## 2013-06-27 ENCOUNTER — Encounter (HOSPITAL_COMMUNITY): Payer: Self-pay | Admitting: Emergency Medicine

## 2013-06-27 ENCOUNTER — Emergency Department (HOSPITAL_COMMUNITY)
Admission: EM | Admit: 2013-06-27 | Discharge: 2013-06-27 | Disposition: A | Discharge status: Home / Self Care | Payer: Self-pay | Attending: Emergency Medicine | Admitting: Emergency Medicine

## 2013-06-27 DIAGNOSIS — S62339A Displaced fracture of neck of unspecified metacarpal bone, initial encounter for closed fracture: Secondary | ICD-10-CM | POA: Insufficient documentation

## 2013-06-27 DIAGNOSIS — I10 Essential (primary) hypertension: Secondary | ICD-10-CM | POA: Insufficient documentation

## 2013-06-27 DIAGNOSIS — F17200 Nicotine dependence, unspecified, uncomplicated: Secondary | ICD-10-CM | POA: Insufficient documentation

## 2013-06-27 MED ORDER — NAPROXEN 500 MG PO TABS: 500.0000 mg | ORAL_TABLET | Freq: Two times a day (BID) | ORAL | Status: DC

## 2013-06-27 MED ORDER — HYDROCODONE-ACETAMINOPHEN 5-325 MG PO TABS: ORAL_TABLET | ORAL | Status: DC
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
## 2013-06-27 NOTE — ED Notes (Signed)
Right hand swollen with 2 abrasions.

## 2013-06-27 NOTE — Progress Notes (Signed)
Orthopedic Tech Progress Note Patient Details:  Walter Salinas Apr 28, 1991 [id number]  Ortho Devices Type of Ortho Device: Ace wrap;Ulna gutter splint Ortho Device/Splint Location: RUE Ortho Device/Splint Interventions: Ordered;Application   Jennye Moccasin 06/27/2013, 3:18 PM
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
## 2013-06-28 NOTE — ED Provider Notes (Signed)
Medical screening examination/treatment/procedure(s) were performed by non-physician practitioner and as supervising physician I was immediately available for consultation/collaboration.  Ethelda Chick, MD 06/28/13 [phone]

## 2014-07-01 ENCOUNTER — Emergency Department (HOSPITAL_COMMUNITY)
Admission: EM | Admit: 2014-07-01 | Discharge: 2014-07-01 | Disposition: A | Discharge status: Home / Self Care | Payer: Self-pay | Attending: Emergency Medicine | Admitting: Emergency Medicine

## 2014-07-01 ENCOUNTER — Encounter (HOSPITAL_COMMUNITY): Payer: Self-pay | Admitting: Emergency Medicine

## 2014-07-01 DIAGNOSIS — I10 Essential (primary) hypertension: Secondary | ICD-10-CM | POA: Insufficient documentation

## 2014-07-01 DIAGNOSIS — F17200 Nicotine dependence, unspecified, uncomplicated: Secondary | ICD-10-CM | POA: Insufficient documentation

## 2014-07-01 DIAGNOSIS — J029 Acute pharyngitis, unspecified: Secondary | ICD-10-CM | POA: Insufficient documentation

## 2014-07-01 DIAGNOSIS — R05 Cough: Secondary | ICD-10-CM | POA: Insufficient documentation

## 2014-07-01 DIAGNOSIS — R059 Cough, unspecified: Secondary | ICD-10-CM | POA: Insufficient documentation

## 2014-07-01 DIAGNOSIS — J069 Acute upper respiratory infection, unspecified: Secondary | ICD-10-CM | POA: Insufficient documentation

## 2014-07-01 LAB — RAPID STREP SCREEN (MED CTR MEBANE ONLY): Streptococcus, Group A Screen (Direct): NEGATIVE

## 2014-07-01 MED ORDER — BENZONATATE 100 MG PO CAPS: 100.0000 mg | ORAL_CAPSULE | Freq: Three times a day (TID) | ORAL | Status: DC

## 2014-07-01 MED ORDER — HYDROCOD POLST-CHLORPHEN POLST 10-8 MG/5ML PO LQCR
5.0000 mL | Freq: Once | ORAL | Status: AC
  Administered 2014-07-01: 5 mL via ORAL
  Filled 2014-07-01: qty 5

## 2014-07-01 MED ORDER — IBUPROFEN 600 MG PO TABS: 600.0000 mg | ORAL_TABLET | Freq: Four times a day (QID) | ORAL | Status: DC | PRN

## 2014-07-01 NOTE — ED Provider Notes (Signed)
CSN: [id number]     Arrival date & time 07/01/14  0721 History   First MD Initiated Contact with Patient 07/01/14 [phone]     Chief Complaint  Patient presents with  . Cough  . Sore Throat     (Consider location/radiation/quality/duration/timing/severity/associated sxs/prior Treatment) HPI Comments: Patient presents with complaint of sore throat and nonproductive cough which started yesterday. Patient has also had nasal congestion and a runny nose. He denies fever, ear pain, nausea, vomiting, abdominal pain, dysuria, rash. No recent tick bites. Patient has been taking over-the-counter cough medication without relief. The onset of this condition was acute. The course is constant. Aggravating factors: none. Alleviating factors: none.    Patient is a 23 y.o. male presenting with cough and pharyngitis. The history is provided by the patient.  Cough Associated symptoms: rhinorrhea and sore throat   Associated symptoms: no chills, no ear pain, no fever, no headaches, no myalgias, no rash, no shortness of breath and no wheezing   Sore Throat Associated symptoms include congestion, coughing and a sore throat. Pertinent negatives include no abdominal pain, chills, fatigue, fever, headaches, myalgias, nausea, rash or vomiting.    Past Medical History  Diagnosis Date  . Hypertension    History reviewed. No pertinent past surgical history. No family history on file. History  Substance Use Topics  . Smoking status: Current Every Day Smoker -- 2.00 packs/day    Types: Cigarettes  . Smokeless tobacco: Not on file  . Alcohol Use: Yes     Comment: Sometimes    Review of Systems  Constitutional: Negative for fever, chills and fatigue.  HENT: Positive for congestion, rhinorrhea and sore throat. Negative for ear pain and sinus pressure.   Eyes: Negative for redness.  Respiratory: Positive for cough. Negative for shortness of breath and wheezing.   Gastrointestinal: Negative for nausea, vomiting,  abdominal pain and diarrhea.  Genitourinary: Negative for dysuria.  Musculoskeletal: Negative for myalgias and neck stiffness.  Skin: Negative for rash.  Neurological: Negative for headaches.  Hematological: Negative for adenopathy.    Allergies  Review of patient's allergies indicates no known allergies.  Home Medications   Prior to Admission medications   Medication Sig Start Date End Date Taking? Authorizing Provider  guaiFENesin-dextromethorphan (ROBITUSSIN DM) 100-10 MG/5ML syrup Take 10 mLs by mouth every 4 (four) hours as needed for cough.   Yes Historical Provider, MD   BP 158/103  Pulse 60  Temp(Src) 97.6 F (36.4 C) (Oral)  Resp 18  SpO2 100%  Physical Exam  Nursing note and vitals reviewed. Constitutional: He appears well-developed and well-nourished.  HENT:  Head: Normocephalic and atraumatic.  Right Ear: Tympanic membrane, external ear and ear canal normal.  Left Ear: Tympanic membrane, external ear and ear canal normal.  Nose: Mucosal edema and rhinorrhea present.  Mouth/Throat: Uvula is midline and mucous membranes are normal. Mucous membranes are not dry. No trismus in the jaw. No uvula swelling. Posterior oropharyngeal erythema present. No oropharyngeal exudate, posterior oropharyngeal edema or tonsillar abscesses.  Eyes: Conjunctivae are normal. Right eye exhibits no discharge. Left eye exhibits no discharge.  Neck: Normal range of motion. Neck supple.  Cardiovascular: Normal rate, regular rhythm and normal heart sounds.   Pulmonary/Chest: Effort normal and breath sounds normal. No respiratory distress. He has no wheezes. He has no rales.  Abdominal: Soft. There is no tenderness.  Lymphadenopathy:    He has no cervical adenopathy.  Neurological: He is alert.  Skin: Skin is warm and dry.  Psychiatric:  He has a normal mood and affect.    ED Course  Procedures (including critical care time) Labs Review Labs Reviewed  RAPID STREP SCREEN  CULTURE, GROUP  A STREP    Imaging Review No results found.   EKG Interpretation None      7:54 AM Patient seen and examined. Work-up initiated. Medications ordered.   Vital signs reviewed and are as follows: Filed Vitals:   07/01/14 0732  BP: 158/103  Pulse: 60  Temp: 97.6 F (36.4 C)  Resp: 18   8:31 AM strep negative. Patient counseled on supportive care for viral URI and s/s to return including worsening symptoms, persistent fever, persistent vomiting, or if they have any other concerns. Urged to see PCP if symptoms persist for more than 3 days. Patient verbalizes understanding and agrees with plan.     MDM   Final diagnoses:  Upper respiratory infection   Patient with upper respiratory tract infection, negative strep test. Patient appears well, nontoxic. Will treat symptoms with ibuprofen and Tessalon. No fever. Do not suspect pneumonia given normal lung exam.    Renne Crigler, PA-C 07/01/14 [phone]

## 2014-07-01 NOTE — Discharge Instructions (Signed)
Please read and follow all provided instructions.  Your diagnoses today include:  1. Upper respiratory infection    You appear to have an upper respiratory infection (URI). An upper respiratory tract infection, or cold, is a viral infection of the air passages leading to the lungs. It should improve gradually after 5-7 days. You may have a lingering cough that lasts for 2- 4 weeks after the infection.  Tests performed today include:  Vital signs. See below for your results today.   Strep test - negative  Medications prescribed:   Tessalon Perles - cough suppressant medication   Ibuprofen (Motrin, Advil) - anti-inflammatory pain medication  Do not exceed 600mg  ibuprofen every 6 hours, take with food  You have been prescribed an anti-inflammatory medication or NSAID. Take with food. Take smallest effective dose for the shortest duration needed for your pain. Stop taking if you experience stomach pain or vomiting.   Take any prescribed medications only as directed. Treatment for your infection is aimed at treating the symptoms. There are no medications, such as antibiotics, that will cure your infection.   Home care instructions:  Follow any educational materials contained in this packet.   Your illness is contagious and can be spread to others, especially during the first 3 or 4 days. It cannot be cured by antibiotics or other medicines. Take basic precautions such as washing your hands often, covering your mouth when you cough or sneeze, and avoiding public places where you could spread your illness to others.   Please continue drinking plenty of fluids.  Use over-the-counter medicines as needed as directed on packaging for symptom relief.  You may also use ibuprofen or tylenol as directed on packaging for pain or fever.  Do not take multiple medicines containing Tylenol or acetaminophen to avoid taking too much of this medication.  Follow-up instructions: Please follow-up with your  primary care provider in the next 3 days for further evaluation of your symptoms if you are not feeling better.   Return instructions:   Please return to the Emergency Department if you experience worsening symptoms.   RETURN IMMEDIATELY IF you develop shortness of breath, confusion or altered mental status, a new rash, become dizzy, faint, or poorly responsive, or are unable to be cared for at home.  Please return if you have persistent vomiting and cannot keep down fluids or develop a fever that is not controlled by tylenol or motrin.    Please return if you have any other emergent concerns.  Additional Information:  Your vital signs today were: BP 158/103   Pulse 60   Temp(Src) 97.6 F (36.4 C) (Oral)   Resp 18   SpO2 100% If your blood pressure (BP) was elevated above 135/85 this visit, please have this repeated by your doctor within one month. --------------

## 2014-07-01 NOTE — ED Notes (Signed)
Patient states has had a cough and sore throat "for a while".   Patient states has been taking cold and cough medicine at home.

## 2014-07-01 NOTE — ED Provider Notes (Signed)
Medical screening examination/treatment/procedure(s) were performed by non-physician practitioner and as supervising physician I was immediately available for consultation/collaboration.     Suzi RootsKevin E Tor Tsuda, MD 07/01/14 [phone]

## 2014-07-03 LAB — CULTURE, GROUP A STREP

## 2014-08-29 ENCOUNTER — Encounter (HOSPITAL_COMMUNITY): Payer: Self-pay | Admitting: Emergency Medicine

## 2014-08-29 ENCOUNTER — Emergency Department (HOSPITAL_COMMUNITY): Payer: Self-pay

## 2014-08-29 ENCOUNTER — Emergency Department (HOSPITAL_COMMUNITY)
Admission: EM | Admit: 2014-08-29 | Discharge: 2014-08-29 | Disposition: A | Discharge status: Home / Self Care | Payer: Self-pay | Attending: Emergency Medicine | Admitting: Emergency Medicine

## 2014-08-29 DIAGNOSIS — I10 Essential (primary) hypertension: Secondary | ICD-10-CM | POA: Insufficient documentation

## 2014-08-29 DIAGNOSIS — J069 Acute upper respiratory infection, unspecified: Secondary | ICD-10-CM | POA: Insufficient documentation

## 2014-08-29 DIAGNOSIS — Z720 Tobacco use: Secondary | ICD-10-CM | POA: Insufficient documentation

## 2014-08-29 MED ORDER — HYDROCHLOROTHIAZIDE 25 MG PO TABS: 25.0000 mg | ORAL_TABLET | Freq: Every day | ORAL | Status: DC

## 2014-08-29 MED ORDER — BENZONATATE 100 MG PO CAPS: 100.0000 mg | ORAL_CAPSULE | Freq: Three times a day (TID) | ORAL | Status: DC | PRN

## 2014-08-29 NOTE — ED Provider Notes (Signed)
CSN: [id number]     Arrival date & time 08/29/14  1658 History  This chart was scribed for non-physician practitioner, Walter Morn, FNP,working with Walter Horn, MD, by Walter Salinas, ED Scribe. This patient was seen in room TR11C/TR11C and the patient's care was started at 6:32 PM.  Chief Complaint  Patient presents with  . Cough  . Nasal Congestion   Patient is a 23 y.o. male presenting with cough. The history is provided by the patient. No language interpreter was used.  Cough  HPI Comments:  Walter Salinas is a 23 y.o. male with PMH of HTN who presents to the Emergency Department complaining of worsening nonproductive cough onset three days ago. Pt reports associated nasal congestion and rhinorrhea onset yesterday and thoracic back soreness. He states he had similar cold symptoms about three months ago that resolved on their own. He denies nausea, vomiting, fever or chills. He also requests an HIV test "just to know". He denies penile discharge, unprotected sex or any other symptoms. Pt does not have a PCP.  Past Medical History  Diagnosis Date  . Hypertension    History reviewed. No pertinent past surgical history. No family history on file. History  Substance Use Topics  . Smoking status: Current Every Day Smoker -- 2.00 packs/day    Types: Cigarettes  . Smokeless tobacco: Not on file  . Alcohol Use: Yes     Comment: Sometimes    Review of Systems  HENT: Positive for congestion.   Respiratory: Positive for cough.   Genitourinary: Negative for discharge.  All other systems reviewed and are negative.   Allergies  Review of patient's allergies indicates no known allergies.  Home Medications   Prior to Admission medications   Medication Sig Start Date End Date Taking? Authorizing Provider  ibuprofen (ADVIL,MOTRIN) 600 MG tablet Take 1 tablet (600 mg total) by mouth every 6 (six) hours as needed. 07/01/14  Yes Renne Crigler, PA-C   Triage Vitals: BP 156/102  Pulse  83  Temp(Src) 98.3 F (36.8 C) (Oral)  Resp 18  SpO2 98% Physical Exam  Nursing note and vitals reviewed. Constitutional: He is oriented to person, place, and time. He appears well-developed and well-nourished.  HENT:  Head: Normocephalic and atraumatic.  Right Ear: Tympanic membrane, external ear and ear canal normal.  Left Ear: Tympanic membrane, external ear and ear canal normal.  Nose: Nose normal.  Mouth/Throat: Uvula is midline, oropharynx is clear and moist and mucous membranes are normal.  Eyes: EOM are normal.  Neck: Normal range of motion.  Cardiovascular: Normal rate, regular rhythm and normal heart sounds.  Exam reveals no gallop and no friction rub.   No murmur heard. Pulmonary/Chest: Effort normal. He has rhonchi in the left lower field.  Tender to palpation of thoracic spine.  Musculoskeletal: Normal range of motion.  Neurological: He is alert and oriented to person, place, and time.  Skin: Skin is warm and dry.  Psychiatric: He has a normal mood and affect. His behavior is normal.    ED Course  Procedures (including critical care time) DIAGNOSTIC STUDIES: Oxygen Saturation is 98% on RA, normal by my interpretation.   COORDINATION OF CARE: 6:39 PM- Advised pt that HIV testing was not indicated at this time. Informed him to follow up with the health dept. Will provide resource guide to establish care for a PCP. Will wait for CXR results. Pt verbalizes understanding and agrees to plan.  Medications - No data to display  Labs  Review Labs Reviewed - No data to display  Imaging Review Dg Chest 2 View  08/29/2014   CLINICAL DATA:  Nonproductive cough with weakness, body aches, fever, runny nose and sweats for 3-4 days.  EXAM: CHEST  2 VIEW  COMPARISON:  PA and lateral chest 11/12/2007.  FINDINGS: Heart size and mediastinal contours are within normal limits. Both lungs are clear. Visualized skeletal structures are unremarkable.  IMPRESSION: Negative exam.    Electronically Signed   By: Drusilla Kannerhomas  Dalessio M.D.   On: 08/29/2014 18:31     EKG Interpretation None     Review of prior encounter records indicates patient with persistent mild hypertension. Patient endorses having hypertensive history since teenage years.  Not currently being treated.  Discussed with patient the dangers of untreated hypertension. Will start on HCTZ, and recommend patient establish PCP relationship to monitor and adjust treatment plan. Resource information provided. MDM   Final diagnoses:  None    Viral URI with cough.  I personally performed the services described in this documentation, which was scribed in my presence. The recorded information has been reviewed and is accurate.    Walter Normanavid John Saron Vanorman, NP 08/30/14 [phone]

## 2014-08-29 NOTE — Discharge Instructions (Signed)
Hypertension °Hypertension, commonly called high blood pressure, is when the force of blood pumping through your arteries is too strong. Your arteries are the blood vessels that carry blood from your heart throughout your body. A blood pressure reading consists of a higher number over a lower number, such as 110/72. The higher number (systolic) is the pressure inside your arteries when your heart pumps. The lower number (diastolic) is the pressure inside your arteries when your heart relaxes. Ideally you want your blood pressure below 120/80. °Hypertension forces your heart to work harder to pump blood. Your arteries may become narrow or stiff. Having hypertension puts you at risk for heart disease, stroke, and other problems.  °RISK FACTORS °Some risk factors for high blood pressure are controllable. Others are not.  °Risk factors you cannot control include:  °· Race. You may be at higher risk if you are African American. °· Age. Risk increases with age. °· Gender. Men are at higher risk than women before age 45 years. After age 65, women are at higher risk than men. °Risk factors you can control include: °· Not getting enough exercise or physical activity. °· Being overweight. °· Getting too much fat, sugar, calories, or salt in your diet. °· Drinking too much alcohol. °SIGNS AND SYMPTOMS °Hypertension does not usually cause signs or symptoms. Extremely high blood pressure (hypertensive crisis) may cause headache, anxiety, shortness of breath, and nosebleed. °DIAGNOSIS  °To check if you have hypertension, your health care provider will measure your blood pressure while you are seated, with your arm held at the level of your heart. It should be measured at least twice using the same arm. Certain conditions can cause a difference in blood pressure between your right and left arms. A blood pressure reading that is higher than normal on one occasion does not mean that you need treatment. If one blood pressure reading  is high, ask your health care provider about having it checked again. °TREATMENT  °Treating high blood pressure includes making lifestyle changes and possibly taking medicine. Living a healthy lifestyle can help lower high blood pressure. You may need to change some of your habits. °Lifestyle changes may include: °· Following the DASH diet. This diet is high in fruits, vegetables, and whole grains. It is low in salt, red meat, and added sugars. °· Getting at least 2½ hours of brisk physical activity every week. °· Losing weight if necessary. °· Not smoking. °· Limiting alcoholic beverages. °· Learning ways to reduce stress. ° If lifestyle changes are not enough to get your blood pressure under control, your health care provider may prescribe medicine. You may need to take more than one. Work closely with your health care provider to understand the risks and benefits. °HOME CARE INSTRUCTIONS °· Have your blood pressure rechecked as directed by your health care provider.   °· Take medicines only as directed by your health care provider. Follow the directions carefully. Blood pressure medicines must be taken as prescribed. The medicine does not work as well when you skip doses. Skipping doses also puts you at risk for problems.   °· Do not smoke.   °· Monitor your blood pressure at home as directed by your health care provider.  °SEEK MEDICAL CARE IF:  °· You think you are having a reaction to medicines taken. °· You have recurrent headaches or feel dizzy. °· You have swelling in your ankles. °· You have trouble with your vision. °SEEK IMMEDIATE MEDICAL CARE IF: °· You develop a severe headache or confusion. °·   You have unusual weakness, numbness, or feel faint.  You have severe chest or abdominal pain.  You vomit repeatedly.  You have trouble breathing. MAKE SURE YOU:   Understand these instructions.  Will watch your condition.  Will get help right away if you are not doing well or get worse. Document  Released: 11/14/2005 Document Revised: 03/31/2014 Document Reviewed: 09/06/2013 [facility] Patient Information 2015 [place], [place]. This information is not intended to replace advice given to you by your health care provider. Make sure you discuss any questions you have with your health care provider. Upper Respiratory Infection, Adult An upper respiratory infection (URI) is also known as the common cold. It is often caused by a type of germ (virus). Colds are easily spread (contagious). You can pass it to others by kissing, coughing, sneezing, or drinking out of the same glass. Usually, you get better in 1 or 2 weeks.  HOME CARE   Only take medicine as told by your doctor.  Use a warm mist humidifier or breathe in steam from a hot shower.  Drink enough water and fluids to keep your pee (urine) clear or pale yellow.  Get plenty of rest.  Return to work when your temperature is back to normal or as told by your doctor. You may use a face mask and wash your hands to stop your cold from spreading. GET HELP RIGHT AWAY IF:   After the first few days, you feel you are getting worse.  You have questions about your medicine.  You have chills, shortness of breath, or brown or red spit (mucus).  You have yellow or brown snot (nasal discharge) or pain in the face, especially when you bend forward.  You have a fever, puffy (swollen) neck, pain when you swallow, or white spots in the back of your throat.  You have a bad headache, ear pain, sinus pain, or chest pain.  You have a high-pitched whistling sound when you breathe in and out (wheezing).  You have a lasting cough or cough up blood.  You have sore muscles or a stiff neck. MAKE SURE YOU:   Understand these instructions.  Will watch your condition.  Will get help right away if you are not doing well or get worse. Document Released: 05/02/2008 Document Revised: 02/06/2012 Document Reviewed: 02/19/2014 [facility] Patient Information  2015 [place], [place]. This information is not intended to replace advice given to you by your health care provider. Make sure you discuss any questions you have with your health care provider.  Emergency Department Resource Guide 1) Find a Doctor and Pay Out of Pocket Although you won't have to find out who is covered by your insurance plan, it is a good idea to ask around and get recommendations. You will then need to call the office and see if the doctor you have chosen will accept you as a new patient and what types of options they offer for patients who are self-pay. Some doctors offer discounts or will set up payment plans for their patients who do not have insurance, but you will need to ask so you aren't surprised when you get to your appointment.  2) Contact Your Local Health Department Not all health departments have doctors that can see patients for sick visits, but many do, so it is worth a call to see if yours does. If you don't know where your local health department is, you can check in your phone book. The CDC also has a tool to help you  locate your state's health department, and many state websites also have listings of all of their local health departments.  3) Find a Walk-in Clinic If your illness is not likely to be very severe or complicated, you may want to try a walk in clinic. These are popping up all over the country in pharmacies, drugstores, and shopping centers. They're usually staffed by nurse practitioners or physician assistants that have been trained to treat common illnesses and complaints. They're usually fairly quick and inexpensive. However, if you have serious medical issues or chronic medical problems, these are probably not your best option.  No Primary Care Doctor: - Call Health Connect at  [phone] - they can help you locate a primary care doctor that  accepts your insurance, provides certain services, etc. - Physician Referral Service- [phone]  Chronic  Pain Problems: Organization         Address  Phone   Notes  [facility]  [phone] Patients need to be referred by their primary care doctor.   Medication Assistance: Organization         Address  Phone   Notes  [facility] [address] [place] [place], [place] [address] [phone] --Must be a resident of [facility] -- Must have NO insurance coverage whatsoever (no Medicaid/ Medicare, etc.) -- The pt. MUST have a primary care doctor that directs their care regularly and follows them in the community   MedAssist  [phone]   [employer]  [phone]    Agencies that provide inexpensive medical care: Organization         Address  Phone   Notes  [facility]  [phone]   [facility]    [phone]   [facility] [address] [place], [place] [address] [phone]   [facility] [address] [place] [place] [phone]   Planned Parenthood    [phone]   [facility]    [phone]   Community Health and [facility]  [address], [place] Phone:  [phone], Fax:  [phone] Hours of Operation:  9 am - 6 pm, M-F.  Also accepts Medicaid/Medicare and self-pay.  [facility]  [address], [place] Phone: [phone], Fax: [phone]. Hours of Operation:  8:30 am - 5:30 pm, M-F.  Also accepts Medicaid and self-pay.  [facility] [place] [address] [place] Phone: [phone]   [facility] [address] Natasha Bence [place], [place] [phone], Ext. [phone] Mondays & Thursdays: 7-9 AM.  First 15 patients are seen on a first come, first serve basis.    Medicaid-accepting [facility] Providers:  Organization         Address  Phone   Notes  [facility] [address], [place] [phone] Also  accepts self-pay patients.  [facility] [address] Laurell Josephs [place], [place]  [phone]   [facility] [address], [place] [phone]   [facility] [address], [place] [phone]   Renaye Rakers [address], [place]   [phone] Only accepts [place] Access [place] patients after they have their name applied to their card.   Self-Pay (no insurance) in [facility]:  Organization  Address  Phone   Notes  Sickle Cell Patients, [facility] [place] [phone]   [facility] [place] [phone]   [facility] [place]  [place], [address], [place] [phone]   [facility]/Dr. Osei-Bonsu  [address], [place] or [address], [place] [phone] Phone number for both [place] and [place] locations is the same.  [facility] [address] [place] [phone]   [facility] [address] [place] or [address] [phone]   [facility] [address], [place] [phone], phone; [phone], fax Sees patients 1st and 3rd Saturday of every month.  Must not qualify for public or private insurance (i.e. Medicaid, Medicare, [employer] Health Choice, Veterans' Benefits)  Household income should be no more than 200% of the poverty level The clinic cannot treat you if you are pregnant or think you are pregnant  Sexually transmitted diseases are not treated at the clinic.    Dental Care: Organization         Address  Phone  Notes  [facility] Department of [facility] [place] [phone] Accepts children up to age 23 who are enrolled in [place] or [place]; pregnant  women with a Medicaid card; and children who have applied for Medicaid or [place] Health Choice, but were declined, whose parents can pay a reduced fee at time of service.  [facility] Department of [facility]  [address], [place] [phone] Accepts children up to age 45 who are enrolled in [place] or [place]; pregnant women with a Medicaid card; and children who have applied for Medicaid or [place] Health Choice, but were declined, whose parents can pay a reduced fee at time of service.  [facility]  [place] [phone] Patients are seen by appointment only. Walk-ins are not accepted. [facility] will see patients 12 years of age and older. Monday - Tuesday (8am-5pm) Most Wednesdays (8:30-5pm) $30 per visit, cash only  [facility] Adult [facility] Access [facility]  [address], [facility] [phone] Patients are seen by appointment only. Walk-ins are not accepted. [place] will see patients 69 years of age and older. One Wednesday Evening (Monthly: Volunteer Based).  $30 per visit, cash only  [place]  [phone] for adults; Children under age 31, call Graduate Pediatric Dentistry at [phone]. Children aged 22-14, please call [phone] to request a pediatric application.  Dental services are provided in all areas of dental care including fillings, crowns and bridges, complete and partial dentures, implants, gum treatment, root canals, and extractions. Preventive care is also provided. Treatment is provided to both adults and children. Patients are selected via a lottery and there is often a waiting list.   [facility] [address], [place]  [phone] [email or web address]   [facility] [address] [place], [place] [phone], Ext. [phone] Second and Fourth Thursday of each month, opens at 6:30 AM; Clinic ends at 9 AM.  Patients are  seen on a first-come first-served basis, and a limited number are seen during each clinic.   [facility]  [address] [place], [place]  [place], [place] [phone]   Eligibility Requirements You must have lived in [place], [place], or [place] counties for at least the last three months.   You cannot be eligible for state or federal sponsored [employer], including [employer], [place], or [employer].   You generally cannot be eligible for healthcare insurance through your employer.    How to apply: Eligibility screenings are held every Tuesday and Wednesday afternoon from 1:00 pm until 4:00 pm. You do not need an appointment for the interview!  [facility] [address] [place], [place]   [place]  [place] Department  [place]  [phone]    Behavioral Health Resources in the Community: Intensive Outpatient Programs Organization         Address  Phone  Notes  [facility] [place]. [address] [employer] [employer], [place] [phone]   [facility] Outpatient [address], [place], [place]   ADS: Alcohol & Drug Svcs [address], [place], [place]   [place] [address]  [place], [place] or [phone]   Substance Abuse Resources Organization         Address  Phone  Notes  Alcohol and Drug Services  [phone]   [place]  [phone]   [facility] [place]   Chinita Pester  [phone]   Residential & Outpatient Substance Abuse Program  [phone]   Psychological Services Organization         Address  Phone  Notes  [facility]  [place]  [phone]   [place] [address], [place] or [phone]    Mobile Crisis  Teams Organization         Address  Phone  Notes  [facility], Mobile Crisis Care Unit  [phone]   [facility]otherapeutic [facility]  [address] [place], [place]   [place] Levels [address] [place] [phone]    Self-Help/Support Groups Organization         Address  Phone             Notes  [place]. of [place] - variety of support groups  [place] Call for more information  Narcotics Anonymous (NA), Caring Services [address], [employer] [place]  2 meetings at this location   Special educational needs teacher         Address  Phone  Notes  [facility] Residential Treatment [place],    [place]  [phone]   [facility]  [address] [place] [phone], [place], [place]   [place] Johnson, [place] [phone] Admissions: 8am-3pm M-F  Incentives Substance [place] [address]    [place], [place] [phone]   [facility] [address] [place] [address] Jadene Pierini [place], [place]   [facility] [address],  [place], [place]   [facility]ight Programs - Intensive Outpatient [address] Kristeen Mans [address], [place], [place]   [facility] ([place].) [place].,  [place], [place] or [phone]   [facility] Services (RTS) [address], [place], [place] Accepts Medicaid  [facility] [place] [address]  [place] [phone] Substance Abuse/Addiction Treatment   [facility] Resources Organization  Address  Phone  Notes  [facility]  [phone]) [phone]   Angie FavaJulie Brannon, PhD [address], [address] [place], [place]   [phone]) [phone] or [phone]) [phone]   [facility]   [address] [place], [place] [phone](336) [phone]   [facility] [address], [place], [place] [phone]) [phone]  Insurance/Medicaid/sponsorship through [facility]  Faith and Families [address]                                    [place], [place] [phone]) [phone] Therapy/tele-psych/case  [facility] [address].   [place], [place] [phone]) [phone]    Dr. Lolly MustacheArfeen  [phone]) [phone]   [facility]  [employer] [facility] 1) [address], [place] 2) [address], [place] 3)  [address], [place] [phone]) [phone]) [phone]  [phone]) [phone]   [facility] Child Abuse Hotline [phone](336) [phone] or [phone](336) [phone] (After Hours)

## 2014-08-29 NOTE — ED Notes (Signed)
Pt reports NP cough and nasal congestion x 2 days. Repots chest wall pain with coughing, but denies SOB or CP. Pt states "I have been walking around a lot in the rain and then going into air conditioning." Pt in NAD.

## 2014-08-29 NOTE — ED Notes (Signed)
Patient transported to X-ray 

## 2014-09-01 NOTE — ED Provider Notes (Signed)
Medical screening examination/treatment/procedure(s) were performed by non-physician practitioner and as supervising physician I was immediately available for consultation/collaboration.   EKG Interpretation None       Eban Weick M Damone Fancher, MD 09/01/14 1705 

## 2014-10-14 ENCOUNTER — Emergency Department (HOSPITAL_COMMUNITY)
Admission: EM | Admit: 2014-10-14 | Discharge: 2014-10-14 | Disposition: A | Discharge status: Home / Self Care | Payer: Self-pay | Attending: Emergency Medicine | Admitting: Emergency Medicine

## 2014-10-14 ENCOUNTER — Encounter (HOSPITAL_COMMUNITY): Payer: Self-pay | Admitting: Adult Health

## 2014-10-14 DIAGNOSIS — Z720 Tobacco use: Secondary | ICD-10-CM | POA: Insufficient documentation

## 2014-10-14 DIAGNOSIS — I10 Essential (primary) hypertension: Secondary | ICD-10-CM | POA: Insufficient documentation

## 2014-10-14 DIAGNOSIS — Z202 Contact with and (suspected) exposure to infections with a predominantly sexual mode of transmission: Secondary | ICD-10-CM | POA: Insufficient documentation

## 2014-10-14 DIAGNOSIS — R300 Dysuria: Secondary | ICD-10-CM | POA: Insufficient documentation

## 2014-10-14 DIAGNOSIS — Z79899 Other long term (current) drug therapy: Secondary | ICD-10-CM | POA: Insufficient documentation

## 2014-10-14 LAB — URINALYSIS, ROUTINE W REFLEX MICROSCOPIC
Bilirubin Urine: NEGATIVE
GLUCOSE, UA: NEGATIVE mg/dL
KETONES UR: NEGATIVE mg/dL
Leukocytes, UA: NEGATIVE
Nitrite: NEGATIVE
PROTEIN: NEGATIVE mg/dL
Specific Gravity, Urine: 1.02 (ref 1.005–1.030)
UROBILINOGEN UA: 1 mg/dL (ref 0.0–1.0)
pH: 6 (ref 5.0–8.0)

## 2014-10-14 LAB — URINE MICROSCOPIC-ADD ON

## 2014-10-14 MED ORDER — CEFTRIAXONE SODIUM 250 MG IJ SOLR
250.0000 mg | Freq: Once | INTRAMUSCULAR | Status: AC
  Administered 2014-10-14: 250 mg via INTRAMUSCULAR
  Filled 2014-10-14: qty 250

## 2014-10-14 MED ORDER — AZITHROMYCIN 250 MG PO TABS
1000.0000 mg | ORAL_TABLET | Freq: Once | ORAL | Status: AC
  Administered 2014-10-14: 1000 mg via ORAL
  Filled 2014-10-14: qty 4

## 2014-10-14 MED ORDER — LIDOCAINE HCL (PF) 1 % IJ SOLN
INTRAMUSCULAR | Status: AC
  Administered 2014-10-14: 2.5 mL
  Filled 2014-10-14: qty 5

## 2014-10-14 NOTE — Discharge Instructions (Signed)
Please call your doctor for a followup appointment within 24-48 hours. When you talk to your doctor please let them know that you were seen in the emergency department and have them acquire all of your records so that they can discuss the findings with you and formulate a treatment plan to fully care for your new and ongoing problems. Please call and set up an appointment with health Department Please avoid any sexual activity Please have partner(s) tested for STDs Please continue to monitor symptoms closely and if symptoms are to worsen or change (fever greater than 101, chills, sweating, nausea, vomiting, chest pain, shortness of breathe, difficulty breathing, weakness, numbness, tingling, worsening or changes to pain pattern, Abdominal pain, penile discharge, penile bleeding, swelling to the penis, testicular pain and swelling) please report back to the Emergency Department immediately.    Dysuria Dysuria is the medical term for pain with urination. There are many causes for dysuria, but urinary tract infection is the most common. If a urinalysis was performed it can show that there is a urinary tract infection. A urine culture confirms that you or your child is sick. You will need to follow up with a healthcare provider because:  If a urine culture was done you will need to know the culture results and treatment recommendations.  If the urine culture was positive, you or your child will need to be put on antibiotics or know if the antibiotics prescribed are the right antibiotics for your urinary tract infection.  If the urine culture is negative (no urinary tract infection), then other causes may need to be explored or antibiotics need to be stopped. Today laboratory work may have been done and there does not seem to be an infection. If cultures were done they will take at least 24 to 48 hours to be completed. Today x-rays may have been taken and they read as normal. No cause can be found for the  problems. The x-rays may be re-read by a radiologist and you will be contacted if additional findings are made. You or your child may have been put on medications to help with this problem until you can see your primary caregiver. If the problems get better, see your primary caregiver if the problems return. If you were given antibiotics (medications which kill germs), take all of the mediations as directed for the full course of treatment.  If laboratory work was done, you need to find the results. Leave a telephone number where you can be reached. If this is not possible, make sure you find out how you are to get test results. HOME CARE INSTRUCTIONS   Drink lots of fluids. For adults, drink eight, 8 ounce glasses of clear juice or water a day. For children, replace fluids as suggested by your caregiver.  Empty the bladder often. Avoid holding urine for long periods of time.  After a bowel movement, women should cleanse front to back, using each tissue only once.  Empty your bladder before and after sexual intercourse.  Take all the medicine given to you until it is gone. You may feel better in a few days, but TAKE ALL MEDICINE.  Avoid caffeine, tea, alcohol and carbonated beverages, because they tend to irritate the bladder.  In men, alcohol may irritate the prostate.  Only take over-the-counter or prescription medicines for pain, discomfort, or fever as directed by your caregiver.  If your caregiver has given you a follow-up appointment, it is very important to keep that appointment. Not keeping  the appointment could result in a chronic or permanent injury, pain, and disability. If there is any problem keeping the appointment, you must call back to this facility for assistance. SEEK IMMEDIATE MEDICAL CARE IF:   Back pain develops.  A fever develops.  There is nausea (feeling sick to your stomach) or vomiting (throwing up).  Problems are no better with medications or are getting  worse. MAKE SURE YOU:   Understand these instructions.  Will watch your condition.  Will get help right away if you are not doing well or get worse. Document Released: 08/12/2004 Document Revised: 02/06/2012 Document Reviewed: 06/19/2008 [facility] Patient Information 2015 [place], [place]. This information is not intended to replace advice given to you by your health care provider. Make sure you discuss any questions you have with your health care provider. Sexually Transmitted Disease A sexually transmitted disease (STD) is a disease or infection that may be passed (transmitted) from person to person, usually during sexual activity. This may happen by way of saliva, semen, blood, vaginal mucus, or urine. Common STDs include:   Gonorrhea.   Chlamydia.   Syphilis.   HIV and AIDS.   Genital herpes.   Hepatitis B and C.   Trichomonas.   Human papillomavirus (HPV).   Pubic lice.   Scabies.  Mites.  Bacterial vaginosis. WHAT ARE CAUSES OF STDs? An STD may be caused by bacteria, a virus, or parasites. STDs are often transmitted during sexual activity if one person is infected. However, they may also be transmitted through nonsexual means. STDs may be transmitted after:   Sexual intercourse with an infected person.   Sharing sex toys with an infected person.   Sharing needles with an infected person or using unclean piercing or tattoo needles.  Having intimate contact with the genitals, mouth, or rectal areas of an infected person.   Exposure to infected fluids during birth. WHAT ARE THE SIGNS AND SYMPTOMS OF STDs? Different STDs have different symptoms. Some people may not have any symptoms. If symptoms are present, they may include:   Painful or bloody urination.   Pain in the pelvis, abdomen, vagina, anus, throat, or eyes.   A skin rash, itching, or irritation.  Growths, ulcerations, blisters, or sores in the genital and anal areas.  Abnormal vaginal  discharge with or without bad odor.   Penile discharge in men.   Fever.   Pain or bleeding during sexual intercourse.   Swollen glands in the groin area.   Yellow skin and eyes (jaundice). This is seen with hepatitis.   Swollen testicles.  Infertility.  Sores and blisters in the mouth. HOW ARE STDs DIAGNOSED? To make a diagnosis, your health care provider may:   Take a medical history.   Perform a physical exam.   Take a sample of any discharge to examine.  Swab the throat, cervix, opening to the penis, rectum, or vagina for testing.  Test a sample of your first morning urine.   Perform blood tests.   Perform a Pap test, if this applies.   Perform a colposcopy.   Perform a laparoscopy.  HOW ARE STDs TREATED? Treatment depends on the STD. Some STDs may be treated but not cured.   Chlamydia, gonorrhea, trichomonas, and syphilis can be cured with antibiotic medicine.   Genital herpes, hepatitis, and HIV can be treated, but not cured, with prescribed medicines. The medicines lessen symptoms.   Genital warts from HPV can be treated with medicine or by freezing, burning (electrocautery), or surgery.  Warts may come back.   HPV cannot be cured with medicine or surgery. However, abnormal areas may be removed from the cervix, vagina, or vulva.   If your diagnosis is confirmed, your recent sexual partners need treatment. This is true even if they are symptom-free or have a negative culture or evaluation. They should not have sex until their health care providers say it is okay. HOW CAN I REDUCE MY RISK OF GETTING AN STD? Take these steps to reduce your risk of getting an STD:  Use latex condoms, dental dams, and water-soluble lubricants during sexual activity. Do not use petroleum jelly or oils.  Avoid having multiple sex partners.  Do not have sex with someone who has other sex partners.  Do not have sex with anyone you do not know or who is at high  risk for an STD.  Avoid risky sex practices that can break your skin.  Do not have sex if you have open sores on your mouth or skin.  Avoid drinking too much alcohol or taking illegal drugs. Alcohol and drugs can affect your judgment and put you in a vulnerable position.  Avoid engaging in oral and anal sex acts.  Get vaccinated for HPV and hepatitis. If you have not received these vaccines in the past, talk to your health care provider about whether one or both might be right for you.   If you are at risk of being infected with HIV, it is recommended that you take a prescription medicine daily to prevent HIV infection. This is called pre-exposure prophylaxis (PrEP). You are considered at risk if:  You are a man who has sex with other men (MSM).  You are a heterosexual man or woman and are sexually active with more than one partner.  You take drugs by injection.  You are sexually active with a partner who has HIV.  Talk with your health care provider about whether you are at high risk of being infected with HIV. If you choose to begin PrEP, you should first be tested for HIV. You should then be tested every 3 months for as long as you are taking PrEP.  WHAT SHOULD I DO IF I THINK I HAVE AN STD?  See your health care provider.   Tell your sexual partner(s). They should be tested and treated for any STDs.  Do not have sex until your health care provider says it is okay. WHEN SHOULD I GET IMMEDIATE MEDICAL CARE? Contact your health care provider right away if:   You have severe abdominal pain.  You are a man and notice swelling or pain in your testicles.  You are a woman and notice swelling or pain in your vagina. Document Released: 02/04/2003 Document Revised: 11/19/2013 Document Reviewed: 06/04/2013 [facility] Patient Information 2015 [place]. This information is not intended to replace advice given to you by your health care provider. Make sure you discuss any  questions you have with your health care provider.   Emergency Department Resource Guide 1) Find a Doctor and Pay Out of Pocket Although you won't have to find out who is covered by your insurance plan, it is a good idea to ask around and get recommendations. You will then need to call the office and see if the doctor you have chosen will accept you as a new patient and what types of options they offer for patients who are self-pay. Some doctors offer discounts or will set up payment plans for their patients who do  not have insurance, but you will need to ask so you aren't surprised when you get to your appointment.  2) Contact Your Local Health Department Not all health departments have doctors that can see patients for sick visits, but many do, so it is worth a call to see if yours does. If you don't know where your local health department is, you can check in your phone book. The CDC also has a tool to help you locate your state's health department, and many state websites also have listings of all of their local health departments.  3) Find a Walk-in Clinic If your illness is not likely to be very severe or complicated, you may want to try a walk in clinic. These are popping up all over the country in pharmacies, drugstores, and shopping centers. They're usually staffed by nurse practitioners or physician assistants that have been trained to treat common illnesses and complaints. They're usually fairly quick and inexpensive. However, if you have serious medical issues or chronic medical problems, these are probably not your best option.  No Primary Care Doctor: - Call Health Connect at  [phone] - they can help you locate a primary care doctor that  accepts your insurance, provides certain services, etc. - Physician Referral Service- [phone]  Chronic Pain Problems: Organization         Address  Phone   Notes  [facility]  [phone] Patients need to be referred  by their primary care doctor.   Medication Assistance: Organization         Address  Phone   Notes  [facility] Medication [facility] [address] [place] [place], [place] [address] [phone] --Must be a resident of [facility] -- Must have NO insurance coverage whatsoever (no Medicaid/ Medicare, etc.) -- The pt. MUST have a primary care doctor that directs their care regularly and follows them in the community   [facility]  [phone]   [employer]  [phone]    Agencies that provide inexpensive medical care: Organization         Address  Phone   Notes  [facility]  [phone]   [facility]    [phone]   [facility] [address] [place], [place] [address] [phone]   [facility] [address] [place], [place] [phone]   Planned Parenthood    [phone]   [facility]    [phone]   Community Health and [facility]  [address], [place] Phone:  [phone], Fax:  [phone] Hours of Operation:  9 am - 6 pm, M-F.  Also accepts Medicaid/Medicare and self-pay.  [facility]  [address], [place] Phone: [phone], Fax: [phone]. Hours of Operation:  8:30 am - 5:30 pm, M-F.  Also accepts Medicaid and self-pay.  [facility] [place] [address] [place] Phone: [phone]   [facility] [address] Natasha Bence [place], [place] [phone], Ext. [phone] Mondays & Thursdays: 7-9 AM.  First 15 patients are seen on a first come, first serve basis.    Medicaid-accepting [facility] Providers:  Organization         Address  Phone   Notes  [facility] [address], [place] [phone] Also accepts self-pay patients.  [facility] [address]  [address] Laurell Josephs [place], [place]  [phone]   [facility] [address], [place] [phone]   [facility] [address], [place] [phone]   Renaye Rakers [address], [place]   [phone] Only accepts [place] Access [place] patients after they have their name applied to their card.   Self-Pay (no insurance) in [facility]:  Organization         Address  Phone   Notes  Sickle Cell Patients, [facility] Internal Medicine [address] [place], [place] [phone]   [facility] [address] [place], [place] [phone]   [facility] [place]  [address], [place] [phone]   [facility]/Dr. Osei-Bonsu  [address] [place] or [address], [place] [phone] Phone number for both [place] and [place] locations is the same.  [facility]gent [facility] and [facility] [place] [address], [place] [phone]   [facility] [address] [place] or [address] [phone]   [facility] [address], [place] [phone], phone; [phone], fax Sees patients 1st and 3rd Saturday of every month.  Must not qualify for public or private insurance (i.e. Medicaid, Medicare, [place] Health Choice, Veterans' Benefits)  Household income should be no more than 200% of the poverty level The clinic cannot treat you if you are pregnant or think you are pregnant  Sexually transmitted diseases are not treated at the clinic.    Dental Care: Organization         Address  Phone  Notes  [facility] Department of [facility] [address] [place], [place] [phone] Accepts children up to age 31 who are enrolled in [place] or [place] Health Choice; pregnant women with a Medicaid card; and children who have applied for Medicaid or [place] Health Choice, but were declined, whose parents can pay a  reduced fee at time of service.  [facility] Department of [facility]  [address], [place] [phone] Accepts children up to age 79 who are enrolled in [place] or [place] Health Choice; pregnant women with a Medicaid card; and children who have applied for Medicaid or [place] Health Choice, but were declined, whose parents can pay a reduced fee at time of service.  [facility]  [address] [place], [place] [phone] Patients are seen by appointment only. Walk-ins are not accepted. [facility] will see patients 54 years of age and older. Monday - Tuesday (8am-5pm) Most Wednesdays (8:30-5pm) $30 per visit, cash only  [facility] Adult Dental Access PRO[facility]  [address], [facility] [phone] Patients are seen by appointment only. Walk-ins are not accepted. [facility] will see patients 9 years of age and older. One Wednesday Evening (Monthly: Volunteer Based).  $30 per visit, cash only  [employer]  [phone] for adults; Children under age 71, call Graduate Pediatric Dentistry at [phone]. Children aged 77-14, please call [phone] to request a pediatric application.  Dental services are provided in all areas of dental care including fillings, crowns and bridges, complete and partial dentures, implants, gum treatment, root canals, and extractions. Preventive care is also provided. Treatment is provided to both adults and children. Patients are selected  via a lottery and there is often a waiting list.   [facility] [address], [place]  [phone] [email or web address]   [facility] [address] [place], [place] [phone], Ext. [phone] Second and Fourth Thursday of each month, opens at 6:30 AM; Clinic ends at 9 AM.  Patients are seen on a first-come first-served basis, and a limited number are seen during each clinic.   [facility]  [address] Ether Griffins [place], [place] [phone]   Eligibility Requirements You must have lived in [place], [place], or [place] counties for at least the last three months.   You cannot be eligible for state or federal sponsored [place], including [employer], [place], or [employer].   You generally cannot be eligible for healthcare insurance through your employer.    How to apply: Eligibility screenings are held every Tuesday and Wednesday afternoon from 1:00 pm until 4:00 pm. You do not need an appointment for the interview!  [facility] [address] [place], [place] [phone]   [facility] Health Department  [phone]   [facility] Health Department  [phone]   [facility] Health Department  [phone]    Behavioral Health Resources in the Community: Intensive Outpatient Programs Organization         Address  Phone  Notes  [facility] Services [address], [place], [place] [phone]   [facility] Outpatient [address] [place], [place] [phone]   [facility] [address] [place], [place]  [phone]   [facility] Mental Health [address]  [place], [place] [phone] or [phone]   Substance Abuse Resources Organization         Address  Phone  Notes  [facility] Drug Services  [phone]   [facility]  [phone]   [facility]  [phone]   Floydene Flock  [phone]   Residential & Outpatient Substance Abuse Program  [phone]   Psychological Services Organization         Address  Phone  Notes  [facility] Behavioral Health  [phone]   [facility]  [phone]   [facility] Mental Health [address] [place] [phone] or [phone]    Mobile Crisis Teams Organization         Address  Phone  Notes  [facility], Mobile Crisis Care Unit  [phone]    [facility]  [address] [place], [place] [phone]   Doristine Locks [address] [place] [phone]    Self-Help/Support Groups Organization         Address  Phone             Notes  [facility] [place] - variety of support groups  336- I7437963 Call for more information  Narcotics Anonymous (NA), [facility] [address], [employer] [place]  2 meetings at this location   Statistician         Address  Phone  Notes  [facility] [address],    [place]  [phone]   [facility]  [address], [place] [address], [place], [place] [phone]   [facility] [address] [place], [place] [phone] Admissions: 8am-3pm M-F  [facility] [address],    [place], [place] [phone]   [facility] [address]  Starling Manns [place], [place] [phone]   [facility] [address],  [place], [place] [phone]   [facility] - Intensive Outpatient [address]ure[address] Josephs [address], [place], [place] [phone]   [facility] ([facility] [address] [place]  [place], [place] [phone] or [phone]   [facility] ([facility]) [address], [place], [place] [phone] Accepts Medicaid  [facility] [address],  [place] [phone] Substance Abuse/Addiction Treatment   [facility] Organization         Address  Phone  Notes  [facility]  [phone]   Angie Fava, PhD [address] Ervin Knack [place], [place]   [phone] or [phone]   [facility] Behavioral   [address] [place], [place] [phone]   [facility] [address] [place], [place] [phone] Insurance/Medicaid/sponsorship through [facility] and Families [address]                                     [place], [place] [phone] Therapy/tele-psych/case  [facility] [address], [place] [phone]    Dr. Lolly Mustache  [phone]   [facility]  [facility] Dept. 1) [address], [place] 2) [address] [place] 3)  [address], [place] [phone]  [phone]   [facility] Child Abuse Hotline (867)873-8938 or [phone] (After Hours)

## 2014-10-14 NOTE — ED Provider Notes (Signed)
CSN: [id number]     Arrival date & time 10/14/14  1653 History  This chart was scribed for non-physician practitioner Raymon MuttonMarissa Chares Slaymaker, PA-C working with Arby BarretteMarcy Pfeiffer, MD by Murriel HopperAlec Bankhead, ED Scribe. This patient was seen in room TR05C/TR05C and the patient's care was started at 7:38 PM.    Chief Complaint  Patient presents with  . Penis Pain    The history is provided by the patient. No language interpreter was used.     HPI Comments: Walter Salinas is a 23 y.o. male with past medical history of hypertension presenting to the emergency department with dysuria that has been ongoing for approximately one week, intermittently. Patient reports that he has a burning sensation only when he urinates. Stated that he has history of STDs approximately 2 years ago. Reported that he is sexually active, reports that he uses condoms every once in a while. Denied penile discharge, penile bleeding, abdominal pain, nausea, vomiting, diarrhea, melena, hematochezia, penile swelling, groin pain, testicular pain or swelling, fever, chills, neck pain, back pain. PCP none  Past Medical History  Diagnosis Date  . Hypertension    History reviewed. No pertinent past surgical history. History reviewed. No pertinent family history. History  Substance Use Topics  . Smoking status: Current Every Day Smoker -- 2.00 packs/day    Types: Cigarettes  . Smokeless tobacco: Not on file  . Alcohol Use: Yes     Comment: Sometimes    Review of Systems  Constitutional: Negative for fever and chills.  Gastrointestinal: Negative for nausea, vomiting, abdominal pain, diarrhea, constipation, blood in stool and anal bleeding.  Genitourinary: Positive for dysuria. Negative for hematuria, discharge, penile swelling, penile pain and testicular pain.  Musculoskeletal: Negative for back pain, neck pain and neck stiffness.      Allergies  Review of patient's allergies indicates no known allergies.  Home Medications    Prior to Admission medications   Medication Sig Start Date End Date Taking? Authorizing Provider  benzonatate (TESSALON) 100 MG capsule Take 1 capsule (100 mg total) by mouth 3 (three) times daily as needed for cough. 08/29/14   Jimmye Normanavid John Smith, NP  hydrochlorothiazide (HYDRODIURIL) 25 MG tablet Take 1 tablet (25 mg total) by mouth daily. 08/29/14   Jimmye Normanavid John Smith, NP  ibuprofen (ADVIL,MOTRIN) 600 MG tablet Take 1 tablet (600 mg total) by mouth every 6 (six) hours as needed. 07/01/14   Renne CriglerJoshua Geiple, PA-C   BP 132/87 mmHg  Pulse 87  Temp(Src) 97.9 F (36.6 C) (Oral)  Resp 18  SpO2 99% Physical Exam  Constitutional: He is oriented to person, place, and time. He appears well-developed and well-nourished.  HENT:  Head: Normocephalic and atraumatic.  Mouth/Throat: Oropharynx is clear and moist. No oropharyngeal exudate.  Eyes: Conjunctivae and EOM are normal. Pupils are equal, round, and reactive to light. Right eye exhibits no discharge. Left eye exhibits no discharge.  Neck: Normal range of motion. Neck supple. No tracheal deviation present.  Cardiovascular: Normal rate, regular rhythm and normal heart sounds.  Exam reveals no friction rub.   No murmur heard. Pulmonary/Chest: Effort normal and breath sounds normal. No respiratory distress. He has no wheezes. He has no rales.  Abdominal: Soft. Bowel sounds are normal. He exhibits no distension. There is no tenderness. There is no rebound and no guarding.  Negative abdominal distention Bowel sounds normal active in all 4 quadrants Abdomen soft upon palpation Negative pain upon palpation to the abdomen Negative peritoneal signs  Genitourinary: No penile tenderness.  Pelvic exam: negative swelling, erythema, inflammation, lesions, sores, chancres noted to the penis. Negative swelling or pain upon palpation to the testicles. Negative inguinal lymphadenopathy bilaterally. Negative discharge coming from the penis. Negative bleeding. Negative  swelling to the penis. Negative high riding testicles. Negative varicoceles. Negative hydroceles. Exam chaperoned with nurse  Musculoskeletal: Normal range of motion.  Lymphadenopathy:    He has no cervical adenopathy.  Neurological: He is alert and oriented to person, place, and time. No cranial nerve deficit. He exhibits normal muscle tone. Coordination normal.  Skin: Skin is warm and dry. No rash noted. No erythema.  Psychiatric: He has a normal mood and affect. His behavior is normal. Thought content normal.  Nursing note and vitals reviewed.   ED Course  Procedures (including critical care time)  DIAGNOSTIC STUDIES: Oxygen Saturation is 99% on RA, normal by my interpretation.    COORDINATION OF CARE: 7:42 PM Discussed treatment plan with pt at bedside and pt agreed to plan.  Results for orders placed or performed during the hospital encounter of 10/14/14  Urinalysis, Routine w reflex microscopic  Result Value Ref Range   Color, Urine YELLOW YELLOW   APPearance CLEAR CLEAR   Specific Gravity, Urine 1.020 1.005 - 1.030   pH 6.0 5.0 - 8.0   Glucose, UA NEGATIVE NEGATIVE mg/dL   Hgb urine dipstick TRACE (A) NEGATIVE   Bilirubin Urine NEGATIVE NEGATIVE   Ketones, ur NEGATIVE NEGATIVE mg/dL   Protein, ur NEGATIVE NEGATIVE mg/dL   Urobilinogen, UA 1.0 0.0 - 1.0 mg/dL   Nitrite NEGATIVE NEGATIVE   Leukocytes, UA NEGATIVE NEGATIVE  Urine microscopic-add on  Result Value Ref Range   Squamous Epithelial / LPF RARE RARE   RBC / HPF 0-2 <3 RBC/hpf   Bacteria, UA RARE RARE    Labs Review Labs Reviewed  URINALYSIS, ROUTINE W REFLEX MICROSCOPIC - Abnormal; Notable for the following:    Hgb urine dipstick TRACE (*)    All other components within normal limits  GC/CHLAMYDIA PROBE AMP  URINE MICROSCOPIC-ADD ON  RPR  HIV ANTIBODY (ROUTINE TESTING)    Imaging Review No results found.   EKG Interpretation None      MDM   Final diagnoses:  Dysuria  STD exposure     Medications  cefTRIAXone (ROCEPHIN) injection 250 mg (250 mg Intramuscular Given 10/14/14 2035)  azithromycin (ZITHROMAX) tablet 1,000 mg (1,000 mg Oral Given 10/14/14 2034)  lidocaine (PF) (XYLOCAINE) 1 % injection (2.5 mLs  Given 10/14/14 2036)   Filed Vitals:   10/14/14 1724  BP: 132/87  Pulse: 87  Temp: 97.9 F (36.6 C)  TempSrc: Oral  Resp: 18  SpO2: 99%   I personally performed the services described in this documentation, which was scribed in my presence. The recorded information has been reviewed and is accurate.  Urinalysis noted trace hemoglobin-negative nitrites or leukocytes identified. Negative findings of bacteria. Doubt urinary tract infection or pyelonephritis. GC Chlamydia probe, RPR, HIV pending. Patient treated prophylactically with medications for STD. Patient stable, at febrile. Patient not septic appearing. Discharged patient. Referred patient to health department. Discussed with patient to closely monitor symptoms and if symptoms are to worsen or change to report back to the ED - strict return instructions given.  Patient agreed to plan of care, understood, all questions answered.   Raymon MuttonMarissa Daysi Boggan, PA-C 10/14/14 2038  Arby BarretteMarcy Pfeiffer, MD 10/15/14 [phone]

## 2014-10-14 NOTE — ED Notes (Signed)
Pt d/c home with all belongings, pt alert, oriented and ambulatory upon discharge. No new RX prescribed, pt verbalizes understanding of d/c instructions, pt drove self home, no narcotics given in ED
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
## 2014-10-15 LAB — HIV ANTIBODY (ROUTINE TESTING W REFLEX): HIV: NONREACTIVE

## 2014-10-15 LAB — GC/CHLAMYDIA PROBE AMP
CT PROBE, AMP APTIMA: NEGATIVE
GC PROBE AMP APTIMA: NEGATIVE

## 2014-10-15 LAB — RPR

## 2014-10-22 ENCOUNTER — Emergency Department (HOSPITAL_COMMUNITY)
Admission: EM | Admit: 2014-10-22 | Discharge: 2014-10-22 | Disposition: A | Discharge status: Home / Self Care | Payer: Self-pay | Attending: Emergency Medicine | Admitting: Emergency Medicine

## 2014-10-22 ENCOUNTER — Encounter (HOSPITAL_COMMUNITY): Payer: Self-pay | Admitting: Emergency Medicine

## 2014-10-22 DIAGNOSIS — Y998 Other external cause status: Secondary | ICD-10-CM | POA: Insufficient documentation

## 2014-10-22 DIAGNOSIS — Z79899 Other long term (current) drug therapy: Secondary | ICD-10-CM | POA: Insufficient documentation

## 2014-10-22 DIAGNOSIS — W500XXA Accidental hit or strike by another person, initial encounter: Secondary | ICD-10-CM | POA: Insufficient documentation

## 2014-10-22 DIAGNOSIS — I10 Essential (primary) hypertension: Secondary | ICD-10-CM | POA: Insufficient documentation

## 2014-10-22 DIAGNOSIS — Z720 Tobacco use: Secondary | ICD-10-CM | POA: Insufficient documentation

## 2014-10-22 DIAGNOSIS — S01511A Laceration without foreign body of lip, initial encounter: Secondary | ICD-10-CM | POA: Insufficient documentation

## 2014-10-22 DIAGNOSIS — T148XXA Other injury of unspecified body region, initial encounter: Secondary | ICD-10-CM

## 2014-10-22 DIAGNOSIS — Y9389 Activity, other specified: Secondary | ICD-10-CM | POA: Insufficient documentation

## 2014-10-22 DIAGNOSIS — Y9289 Other specified places as the place of occurrence of the external cause: Secondary | ICD-10-CM | POA: Insufficient documentation

## 2014-10-22 MED ORDER — IBUPROFEN 600 MG PO TABS: 600.0000 mg | ORAL_TABLET | Freq: Four times a day (QID) | ORAL | Status: DC | PRN

## 2014-10-22 MED ORDER — PENICILLIN V POTASSIUM 500 MG PO TABS
500.0000 mg | ORAL_TABLET | Freq: Once | ORAL | Status: AC
  Administered 2014-10-22: 500 mg via ORAL
  Filled 2014-10-22: qty 1

## 2014-10-22 MED ORDER — PENICILLIN V POTASSIUM 500 MG PO TABS: 500.0000 mg | ORAL_TABLET | Freq: Four times a day (QID) | ORAL | Status: DC

## 2014-10-22 MED ORDER — IBUPROFEN 200 MG PO TABS
600.0000 mg | ORAL_TABLET | Freq: Once | ORAL | Status: AC
  Administered 2014-10-22: 600 mg via ORAL
  Filled 2014-10-22: qty 3

## 2014-10-22 NOTE — Discharge Instructions (Signed)
Take the antibiotic as directed until all tablet shave been taken Apply warm compresses to the lip every 2-3 hours for 10-15 minutes  Be careful not to burn the area
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
## 2014-12-09 ENCOUNTER — Telehealth: Payer: Self-pay | Admitting: Family Medicine

## 2014-12-09 ENCOUNTER — Other Ambulatory Visit: Payer: Self-pay | Admitting: Family Medicine

## 2014-12-09 MED ORDER — AMOXICILLIN-POT CLAVULANATE 875-125 MG PO TABS: 1.0000 | ORAL_TABLET | Freq: Two times a day (BID) | ORAL | Status: DC

## 2014-12-09 MED ORDER — AMOXICILLIN 875 MG PO TABS: 875.0000 mg | ORAL_TABLET | Freq: Two times a day (BID) | ORAL | Status: DC

## 2014-12-09 NOTE — Telephone Encounter (Signed)
[facility] to her appointment today. Patient complaining of fever, purulent nasal discharge, and cough for >2 weeks. Face tender on exam, lungs clear. Will rx augmentin x10 days for sinus infection. Patient wishes to become a patient of ours again and agrees to fill out new patient paperwork and apply for orange card.

## 2014-12-27 ENCOUNTER — Emergency Department (HOSPITAL_COMMUNITY)
Admission: EM | Admit: 2014-12-27 | Discharge: 2014-12-27 | Disposition: A | Discharge status: Home / Self Care | Payer: Self-pay | Attending: Emergency Medicine | Admitting: Emergency Medicine

## 2014-12-27 ENCOUNTER — Encounter (HOSPITAL_COMMUNITY): Payer: Self-pay

## 2014-12-27 DIAGNOSIS — Z720 Tobacco use: Secondary | ICD-10-CM | POA: Insufficient documentation

## 2014-12-27 DIAGNOSIS — I10 Essential (primary) hypertension: Secondary | ICD-10-CM | POA: Insufficient documentation

## 2014-12-27 DIAGNOSIS — Z792 Long term (current) use of antibiotics: Secondary | ICD-10-CM | POA: Insufficient documentation

## 2014-12-27 DIAGNOSIS — N342 Other urethritis: Secondary | ICD-10-CM | POA: Insufficient documentation

## 2014-12-27 DIAGNOSIS — Z79899 Other long term (current) drug therapy: Secondary | ICD-10-CM | POA: Insufficient documentation

## 2014-12-27 LAB — I-STAT CHEM 8, ED
BUN: 9 mg/dL (ref 6–23)
Calcium, Ion: 1.19 mmol/L (ref 1.12–1.23)
Chloride: 106 mmol/L (ref 96–112)
Creatinine, Ser: 1.1 mg/dL (ref 0.50–1.35)
GLUCOSE: 90 mg/dL (ref 70–99)
HEMATOCRIT: 50 % (ref 39.0–52.0)
Hemoglobin: 17 g/dL (ref 13.0–17.0)
POTASSIUM: 4.2 mmol/L (ref 3.5–5.1)
Sodium: 142 mmol/L (ref 135–145)
TCO2: 22 mmol/L (ref 0–100)

## 2014-12-27 MED ORDER — CEFTRIAXONE SODIUM 250 MG IJ SOLR
250.0000 mg | Freq: Once | INTRAMUSCULAR | Status: AC
  Administered 2014-12-27: 250 mg via INTRAMUSCULAR
  Filled 2014-12-27: qty 250

## 2014-12-27 MED ORDER — AZITHROMYCIN 250 MG PO TABS
1000.0000 mg | ORAL_TABLET | Freq: Once | ORAL | Status: AC
  Administered 2014-12-27: 1000 mg via ORAL
  Filled 2014-12-27: qty 4

## 2014-12-27 MED ORDER — HYDROCHLOROTHIAZIDE 25 MG PO TABS: 25.0000 mg | ORAL_TABLET | Freq: Every day | ORAL | Status: DC

## 2014-12-27 MED ORDER — LIDOCAINE HCL 1 % IJ SOLN
INTRAMUSCULAR | Status: AC
  Administered 2014-12-27: 1.6 mL via INTRAMUSCULAR
  Filled 2014-12-27: qty 20

## 2014-12-27 NOTE — ED Notes (Signed)
Patient c/o dysuria and a sharp penile pain x 1 week. Patient denies any penile drainage.

## 2014-12-27 NOTE — Discharge Instructions (Signed)
Urethritis Your blood pressure was elevated today at 144/101. Start taking the blood pressure medication as prescribed. Get your blood pressure recheck within the next 2-3 weeks. Call the wellness Center to get a primary care physician. Use a condom each time that you have sex. It is the best way to prevent getting sexually transmitted diseases. Urethritis is an inflammation of the tube through which urine exits your bladder (urethra).  CAUSES Urethritis is often caused by an infection in your urethra. The infection can be viral, like herpes. The infection can also be bacterial, like gonorrhea. RISK FACTORS Risk factors of urethritis include:  Having sex without using a condom.  Having multiple sexual partners.  Having poor hygiene. SIGNS AND SYMPTOMS Symptoms of urethritis are less noticeable in women than in men. These symptoms include:  Burning feeling when you urinate (dysuria).  Discharge from your urethra.  Blood in your urine (hematuria).  Urinating more than usual. DIAGNOSIS  To confirm a diagnosis of urethritis, your health care provider will do the following:  Ask about your sexual history.  Perform a physical exam.  Have you provide a sample of your urine for lab testing.  Use a cotton swab to gently collect a sample from your urethra for lab testing. TREATMENT  It is important to treat urethritis. Depending on the cause, untreated urethritis may lead to serious genital infections and possibly infertility. Urethritis caused by a bacterial infection is treated with antibiotic medicine. All sexual partners must be treated.  HOME CARE INSTRUCTIONS  Do not have sex until the test results are known and treatment is completed, even if your symptoms go away before you finish treatment.  If you were prescribed an antibiotic, finish it all even if you start to feel better. SEEK MEDICAL CARE IF:   Your symptoms are not improved in 3 days.  Your symptoms are getting  worse.  You develop abdominal pain or pelvic pain (in women).  You develop joint pain.  You have a fever. SEEK IMMEDIATE MEDICAL CARE IF:   You have severe pain in the belly, back, or side.  You have repeated vomiting. MAKE SURE YOU:  Understand these instructions.  Will watch your condition.  Will get help right away if you are not doing well or get worse. Document Released: 05/10/2001 Document Revised: 03/31/2014 Document Reviewed: 07/15/2013 [facility] Patient Information 2015 [employer] [place]. This information is not intended to replace advice given to you by your health care provider. Make sure you discuss any questions you have with your health care provider.
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
## 2014-12-29 LAB — GC/CHLAMYDIA PROBE AMP (~~LOC~~) NOT AT ARMC
Chlamydia: NEGATIVE
NEISSERIA GONORRHEA: NEGATIVE

## 2014-12-30 LAB — RPR: RPR Ser Ql: NONREACTIVE

## 2014-12-30 LAB — HIV ANTIBODY (ROUTINE TESTING W REFLEX): HIV Screen 4th Generation wRfx: NONREACTIVE

## 2015-08-02 ENCOUNTER — Emergency Department (HOSPITAL_COMMUNITY)
Admission: EM | Admit: 2015-08-02 | Discharge: 2015-08-02 | Disposition: A | Discharge status: Home / Self Care | Payer: Self-pay | Attending: Emergency Medicine | Admitting: Emergency Medicine

## 2015-08-02 ENCOUNTER — Emergency Department (HOSPITAL_COMMUNITY): Payer: Self-pay

## 2015-08-02 ENCOUNTER — Encounter (HOSPITAL_COMMUNITY): Payer: Self-pay | Admitting: Nurse Practitioner

## 2015-08-02 DIAGNOSIS — Z79899 Other long term (current) drug therapy: Secondary | ICD-10-CM | POA: Insufficient documentation

## 2015-08-02 DIAGNOSIS — Z760 Encounter for issue of repeat prescription: Secondary | ICD-10-CM | POA: Insufficient documentation

## 2015-08-02 DIAGNOSIS — Z720 Tobacco use: Secondary | ICD-10-CM | POA: Insufficient documentation

## 2015-08-02 DIAGNOSIS — I10 Essential (primary) hypertension: Secondary | ICD-10-CM | POA: Insufficient documentation

## 2015-08-02 DIAGNOSIS — J029 Acute pharyngitis, unspecified: Secondary | ICD-10-CM | POA: Insufficient documentation

## 2015-08-02 LAB — RAPID STREP SCREEN (MED CTR MEBANE ONLY): STREPTOCOCCUS, GROUP A SCREEN (DIRECT): NEGATIVE

## 2015-08-02 MED ORDER — HYDROCHLOROTHIAZIDE 25 MG PO TABS: 25.0000 mg | ORAL_TABLET | Freq: Every day | ORAL | Status: DC

## 2015-08-02 MED ORDER — LIDOCAINE VISCOUS 2 % MT SOLN: 20.0000 mL | OROMUCOSAL | Status: DC | PRN

## 2015-08-02 MED ORDER — LIDOCAINE VISCOUS 2 % MT SOLN
15.0000 mL | Freq: Once | OROMUCOSAL | Status: AC
  Administered 2015-08-02: 15 mL via OROMUCOSAL
  Filled 2015-08-02: qty 15

## 2015-08-02 MED ORDER — KETOROLAC TROMETHAMINE 30 MG/ML IJ SOLN
30.0000 mg | Freq: Once | INTRAMUSCULAR | Status: AC
  Administered 2015-08-02: 30 mg via INTRAMUSCULAR
  Filled 2015-08-02: qty 1

## 2015-08-02 NOTE — ED Provider Notes (Signed)
CSN: [id number]     Arrival date & time 08/02/15  1526 History   First MD Initiated Contact with Patient 08/02/15 1558     Chief Complaint  Patient presents with  . Sore Throat     (Consider location/radiation/quality/duration/timing/severity/associated sxs/prior Treatment) The history is provided by the patient. No language interpreter was used.   Walter Salinas is a 24 year old male with a history of hypertension who presents for sore throat for the past 2 weeks that is worsening and painful to swallow. He states he has had this multiple times in the past. He is also complaining of a cough that began as productive with green phlegm but has since dried up after chugging some cough medicine. He denies any fever, chills, drooling, shortness of breath, difficulty breathing, difficulty eating or drinking, abdominal pain, nausea, vomiting. He smokes one pack per day. No history of asthma.  Past Medical History  Diagnosis Date  . Hypertension    History reviewed. No pertinent past surgical history. Family History  Problem Relation Age of Onset  . Hypertension Mother   . Hypertension Father    Social History  Substance Use Topics  . Smoking status: Current Every Day Smoker -- 1.00 packs/day    Types: Cigarettes  . Smokeless tobacco: Never Used  . Alcohol Use: Yes     Comment: Sometimes    Review of Systems  Constitutional: Negative for fever and chills.  HENT: Positive for sore throat. Negative for drooling and trouble swallowing.   Respiratory: Positive for cough. Negative for shortness of breath and wheezing.       Allergies  Review of patient's allergies indicates no known allergies.  Home Medications   Prior to Admission medications   Medication Sig Start Date End Date Taking? Authorizing Provider  amoxicillin (AMOXIL) 875 MG tablet Take 1 tablet (875 mg total) by mouth 2 (two) times daily. Patient not taking: Reported on 12/27/2014 12/09/14   Abram Sander, MD   amoxicillin-clavulanate (AUGMENTIN) 875-125 MG per tablet Take 1 tablet by mouth 2 (two) times daily. Patient not taking: Reported on 12/27/2014 12/09/14   Abram Sander, MD  benzonatate (TESSALON) 100 MG capsule Take 1 capsule (100 mg total) by mouth 3 (three) times daily as needed for cough. Patient not taking: Reported on 12/27/2014 08/29/14   [name], NP  hydrochlorothiazide (HYDRODIURIL) 25 MG tablet Take 1 tablet (25 mg total) by mouth daily. 08/02/15   Tessica Cupo Patel-Mills, PA-C  ibuprofen (ADVIL,MOTRIN) 600 MG tablet Take 1 tablet (600 mg total) by mouth every 6 (six) hours as needed. 10/22/14   Earley Favor, NP  lidocaine (XYLOCAINE) 2 % solution Use as directed 20 mLs in the mouth or throat as needed for mouth pain. 08/02/15   Kaleo Condrey Patel-Mills, PA-C  penicillin v potassium (VEETID) 500 MG tablet Take 1 tablet (500 mg total) by mouth 4 (four) times daily. Patient not taking: Reported on 12/27/2014 10/22/14   Earley Favor, NP   BP 173/120 mmHg  Pulse 56  Temp(Src) 98.4 F (36.9 C) (Oral)  Resp 16  Ht 5\' 11"  (1.803 m)  Wt 147 lb 8 oz (66.906 kg)  BMI 20.58 kg/m2  SpO2 100% Physical Exam  Constitutional: He is oriented to person, place, and time. He appears well-developed and well-nourished.  HENT:  Head: Normocephalic and atraumatic.  Mouth/Throat: Uvula is midline, oropharynx is clear and moist and mucous membranes are normal. No trismus in the jaw. No uvula swelling. No oropharyngeal exudate, posterior oropharyngeal edema, posterior oropharyngeal  erythema or tonsillar abscesses.  No tonsillar edema or exudates. No posterior oropharyngeal erythema. No palatal swelling. No hot potato voice or difficulty speaking. No drooling. No trismus. No anterior cervical lymphadenopathy.  Patent airway. Uvula midline and without swelling.  Eyes: Conjunctivae are normal.  Neck: Normal range of motion.  No cervical anterior lymphadenopathy.  Cardiovascular: Normal rate, regular rhythm and normal heart  sounds.   Pulmonary/Chest: Effort normal. No respiratory distress. He has no decreased breath sounds. He has wheezes in the right lower field, the left middle field and the left lower field. He has rhonchi in the left middle field and the left lower field.  Abdominal: Soft. He exhibits no distension. There is no tenderness.  No abdominal tenderness to palpation.  Musculoskeletal: Normal range of motion.  Neurological: He is alert and oriented to person, place, and time.  Skin: Skin is warm and dry.  Psychiatric: He has a normal mood and affect.  Nursing note and vitals reviewed.   ED Course  Procedures (including critical care time) Labs Review Labs Reviewed  RAPID STREP SCREEN (NOT AT [facility])  CULTURE, GROUP A STREP    Imaging Review Dg Chest 2 View  08/02/2015   CLINICAL DATA:  24 year old male with sore throat and cough. Wheezing and lymph nodes swelling. Initial encounter.  EXAM: CHEST  2 VIEW  COMPARISON:  08/29/2014.  FINDINGS: Mildly larger lung volumes. Normal cardiac size and mediastinal contours. Visualized tracheal air column is within normal limits. T1 spina bifida occulta incidentally re- identified. No pneumothorax or pulmonary edema. No pleural effusion or consolidation. Lung markings are within normal limits. No acute osseous abnormality identified.  IMPRESSION: No acute cardiopulmonary abnormality.   Electronically Signed   By: Odessa Fleming M.D.   On: 08/02/2015 17:04   I have personally reviewed and evaluated these image results as part of my medical decision-making.   EKG Interpretation None      MDM   Final diagnoses:  Pharyngitis  Medication refill   Patient presents for sore throat. Chest x-ray negative for pneumonia, or edema. Strep negative. He is well-appearing and in no acute distress. He has no difficulty breathing or swallowing and is tolerating by mouth fluids. I gave the patient return precautions. He also requested refill on his blood pressure medication  which I have given him today. He has a follow-up appointment with [facility] in 5 days. I discussed keeping this appointment and discussing his blood pressure medication and having his throat rechecked at that time. Patient verbally agrees with the plan. Medications  ketorolac (TORADOL) 30 MG/ML injection 30 mg (30 mg Intramuscular Given 08/02/15 1703)  lidocaine (XYLOCAINE) 2 % viscous mouth solution 15 mL (15 mLs Mouth/Throat Given 08/02/15 1703)  Rx: Viscous lidocaine and hydrochlorothiazide     Catha Gosselin, PA-C 08/02/15 1928  Blake Divine, MD 08/05/15 1430

## 2015-08-02 NOTE — Care Management (Signed)
ED CM consulted by [facility] RN on FT concerning patient needing to establish care with a PCP. Patient presents to [facility] ED with sore Throat, Reviewed record, no PCP or Health Insurance listed. Met with patient in [place] confirmed information, patient states, he was told that he had hypertension in the past but never followed up with blood pressure issues. Discussed the [facility] walk-in clinic on Fridays, and patient is amendable with establishing care at the clinic, written information provided for follow up care at the clinic, also placed on the AVS. No further ED CM needs identified.

## 2015-08-02 NOTE — ED Notes (Signed)
Declined W/C at D/C and was escorted to lobby by RN. 

## 2015-08-02 NOTE — ED Notes (Signed)
He c/o sore throat for past week and he feels like his lymph nodes are swollen. No difficulty swallowing or breathing.

## 2015-08-02 NOTE — Discharge Instructions (Signed)
Pharyngitis Keep your follow-up appointment with [facility] on Friday. Return for any difficulty breathing or swallowing.  Pharyngitis is redness, pain, and swelling (inflammation) of your pharynx.  CAUSES  Pharyngitis is usually caused by infection. Most of the time, these infections are from viruses (viral) and are part of a cold. However, sometimes pharyngitis is caused by bacteria (bacterial). Pharyngitis can also be caused by allergies. Viral pharyngitis may be spread from person to person by coughing, sneezing, and personal items or utensils (cups, forks, spoons, toothbrushes). Bacterial pharyngitis may be spread from person to person by more intimate contact, such as kissing.  SIGNS AND SYMPTOMS  Symptoms of pharyngitis include:   Sore throat.   Tiredness (fatigue).   Low-grade fever.   Headache.  Joint pain and muscle aches.  Skin rashes.  Swollen lymph nodes.  Plaque-like film on throat or tonsils (often seen with bacterial pharyngitis). DIAGNOSIS  Your health care provider will ask you questions about your illness and your symptoms. Your medical history, along with a physical exam, is often all that is needed to diagnose pharyngitis. Sometimes, a rapid strep test is done. Other lab tests may also be done, depending on the suspected cause.  TREATMENT  Viral pharyngitis will usually get better in 3-4 days without the use of medicine. Bacterial pharyngitis is treated with medicines that kill germs (antibiotics).  HOME CARE INSTRUCTIONS   Drink enough water and fluids to keep your urine clear or pale yellow.   Only take over-the-counter or prescription medicines as directed by your health care provider:   If you are prescribed antibiotics, make sure you finish them even if you start to feel better.   Do not take aspirin.   Get lots of rest.   Gargle with 8 oz of salt water ( tsp of salt per 1 qt of water) as often as every 1-2 hours to soothe your  throat.   Throat lozenges (if you are not at risk for choking) or sprays may be used to soothe your throat. SEEK MEDICAL CARE IF:   You have large, tender lumps in your neck.  You have a rash.  You cough up green, yellow-brown, or bloody spit. SEEK IMMEDIATE MEDICAL CARE IF:   Your neck becomes stiff.  You drool or are unable to swallow liquids.  You vomit or are unable to keep medicines or liquids down.  You have severe pain that does not go away with the use of recommended medicines.  You have trouble breathing (not caused by a stuffy nose). MAKE SURE YOU:   Understand these instructions.  Will watch your condition.  Will get help right away if you are not doing well or get worse. Document Released: 11/14/2005 Document Revised: 09/04/2013 Document Reviewed: 07/22/2013 [facility] Patient Information 2015 [place], [place]. This information is not intended to replace advice given to you by your health care provider. Make sure you discuss any questions you have with your health care provider.

## 2015-08-04 LAB — CULTURE, GROUP A STREP: Strep A Culture: NEGATIVE

## 2015-09-07 ENCOUNTER — Emergency Department (HOSPITAL_COMMUNITY): Payer: Self-pay

## 2015-09-07 ENCOUNTER — Emergency Department (HOSPITAL_COMMUNITY)
Admission: EM | Admit: 2015-09-07 | Discharge: 2015-09-07 | Disposition: A | Discharge status: Home / Self Care | Payer: Self-pay | Attending: Emergency Medicine | Admitting: Emergency Medicine

## 2015-09-07 ENCOUNTER — Encounter (HOSPITAL_COMMUNITY): Payer: Self-pay | Admitting: *Deleted

## 2015-09-07 DIAGNOSIS — I10 Essential (primary) hypertension: Secondary | ICD-10-CM | POA: Insufficient documentation

## 2015-09-07 DIAGNOSIS — Z202 Contact with and (suspected) exposure to infections with a predominantly sexual mode of transmission: Secondary | ICD-10-CM | POA: Insufficient documentation

## 2015-09-07 DIAGNOSIS — Z720 Tobacco use: Secondary | ICD-10-CM | POA: Insufficient documentation

## 2015-09-07 DIAGNOSIS — R059 Cough, unspecified: Secondary | ICD-10-CM

## 2015-09-07 DIAGNOSIS — R05 Cough: Secondary | ICD-10-CM

## 2015-09-07 DIAGNOSIS — Z711 Person with feared health complaint in whom no diagnosis is made: Secondary | ICD-10-CM

## 2015-09-07 DIAGNOSIS — J069 Acute upper respiratory infection, unspecified: Secondary | ICD-10-CM | POA: Insufficient documentation

## 2015-09-07 NOTE — ED Provider Notes (Signed)
CSN: [id number]     Arrival date & time 09/07/15  0902 History  By signing my name below, I, Essence Howell, attest that this documentation has been prepared under the direction and in the presence of [employer], PA-C. Electronically Signed: Charline Bills, ED Scribe 09/07/2015 at 10:42 AM.   Chief Complaint  Patient presents with  . Cough  . Exposure to STD   The history is provided by the patient. No language interpreter was used.   HPI Comments: RIVAAN KENDALL is a 24 y.o. male, with a h/o HTN, who presents to the Emergency Department complaining of gradually worsening cough for the past 2-3 weeks. Pt states that cough was productive until 2-3 days ago. He reports associated chest pain only with coughing, nasal congestion, rhinorrhea. He denies ear pain, sinus pressure, SOB, nausea, vomiting.  He has not taken anything for symptoms prior to arrival.  Pt also presents for STD screening. He reports unprotected sexual intercourse with his current male partner who does not have any known STDs. Pt reports associated dysuria a few days ago, but none since that time. He denies rash, lesions, penile discharge. No h/o STD.   Past Medical History  Diagnosis Date  . Hypertension    No past surgical history on file. Family History  Problem Relation Age of Onset  . Hypertension Mother   . Hypertension Father    Social History  Substance Use Topics  . Smoking status: Current Every Day Smoker -- 1.00 packs/day    Types: Cigarettes  . Smokeless tobacco: Never Used  . Alcohol Use: Yes     Comment: Sometimes    Review of Systems  HENT: Positive for congestion and rhinorrhea. Negative for ear pain and sinus pressure.   Respiratory: Positive for cough. Negative for shortness of breath.   Cardiovascular: Positive for chest pain (only with coughing).  Gastrointestinal: Negative for nausea and vomiting.  Genitourinary: Positive for dysuria. Negative for discharge.  Skin: Negative for  rash.  All other systems reviewed and are negative.  Allergies  Review of patient's allergies indicates no known allergies.  Home Medications   Prior to Admission medications   Medication Sig Start Date End Date Taking? Authorizing Provider  amoxicillin (AMOXIL) 875 MG tablet Take 1 tablet (875 mg total) by mouth 2 (two) times daily. Patient not taking: Reported on 12/27/2014 12/09/14   Abram Sander, MD  amoxicillin-clavulanate (AUGMENTIN) 875-125 MG per tablet Take 1 tablet by mouth 2 (two) times daily. Patient not taking: Reported on 12/27/2014 12/09/14   Abram Sander, MD  benzonatate (TESSALON) 100 MG capsule Take 1 capsule (100 mg total) by mouth 3 (three) times daily as needed for cough. Patient not taking: Reported on 12/27/2014 08/29/14   [name], NP  hydrochlorothiazide (HYDRODIURIL) 25 MG tablet Take 1 tablet (25 mg total) by mouth daily. 08/02/15   [name], PA-C  ibuprofen (ADVIL,MOTRIN) 600 MG tablet Take 1 tablet (600 mg total) by mouth every 6 (six) hours as needed. 10/22/14   Earley Favor, NP  lidocaine (XYLOCAINE) 2 % solution Use as directed 20 mLs in the mouth or throat as needed for mouth pain. 08/02/15   [name], PA-C  penicillin v potassium (VEETID) 500 MG tablet Take 1 tablet (500 mg total) by mouth 4 (four) times daily. Patient not taking: Reported on 12/27/2014 10/22/14   Earley Favor, NP   BP 167/105 mmHg  Pulse 80  Temp(Src) 97.8 F (36.6 C) (Oral)  Resp 16  SpO2  99% Physical Exam  Constitutional: He is oriented to person, place, and time. He appears well-developed and well-nourished. No distress.  HENT:  Head: Normocephalic and atraumatic.  Right Ear: Tympanic membrane normal.  Left Ear: Tympanic membrane normal.  Mouth/Throat: Oropharynx is clear and moist and mucous membranes are normal.  Eyes: Conjunctivae and EOM are normal.  Neck: Normal range of motion. Neck supple. No tracheal deviation present.  Cardiovascular: Normal rate and normal  heart sounds.   Pulmonary/Chest: Effort normal and breath sounds normal. No respiratory distress. He has no wheezes. He has no rhonchi. He has no rales.  Genitourinary: Testes normal and penis normal. Right testis shows no mass, no swelling and no tenderness. Left testis shows no mass, no swelling and no tenderness. Circumcised. No penile tenderness.  Chaperone present for exam.   Musculoskeletal: Normal range of motion.  Neurological: He is alert and oriented to person, place, and time.  Skin: Skin is warm and dry.  Psychiatric: He has a normal mood and affect. His behavior is normal.  Nursing note and vitals reviewed.  ED Course  Procedures (including critical care time) DIAGNOSTIC STUDIES: Oxygen Saturation is 99% on RA, normal by my interpretation.    COORDINATION OF CARE: 9:28 AM-Discussed treatment plan which includes CXR and STD screening with pt at bedside and pt agreed to plan.   Labs Review Labs Reviewed - No data to display  Imaging Review Dg Chest 2 View  09/07/2015   CLINICAL DATA:  One week history of cough.  Hypertension.  EXAM: CHEST  2 VIEW  COMPARISON:  August 02, 2015  FINDINGS: Lungs are clear. The heart size and pulmonary vascularity are normal. No adenopathy. No bone lesions. Spina bifida occulta is noted incidentally at T1.  IMPRESSION: No edema or consolidation.   Electronically Signed   By: Bretta Bang III M.D.   On: 09/07/2015 10:28   I have personally reviewed and evaluated these images and lab results as part of my medical decision-making.   EKG Interpretation None      MDM   Final diagnoses:  None  Patient presents today with complaints of cough for the past 2-3 weeks.  CXR is negative.  VSS.  No hypoxia.  Suspect URI.  Patient also requesting to be tested for STD's.  GC, Chlamydia, RPR, and HIV pending.  Patient currently asymptomatic.  Stable for discharge.  Return precautions given.  I personally performed the services described in this  documentation, which was scribed in my presence. The recorded information has been reviewed and is accurate.    Santiago Glad, PA-C 09/07/15 1139  Pricilla Loveless, MD 09/08/15 [phone]

## 2015-09-07 NOTE — ED Notes (Signed)
Declined W/C at D/C and was escorted to lobby by RN. 

## 2015-09-07 NOTE — Discharge Instructions (Signed)
You have been treated in the emergency department for an infection, possibly sexually transmitted. Results of your gonorrhea and chlamydia tests are pending and you will be notified if they are positive. It is very important to practice safe sex and use condoms when sexually active. If your results are positive you need to notify all sexual partners so they can be treated as well. The website [email or web address].net/ can be used to send anonymous text messages or emails to alert sexual contacts. Follow up with your doctor in regards to today's visit.    Gonorrhea and Chlamydia SYMPTOMS  In males, symptoms may go unnoticed. Symptoms that are more noticeable can include:  Burning with urination.  Pain in the testicles.  Watery mucous-like discharge from the penis.  It can cause longstanding (chronic) pelvic pain after frequent infections.  TREATMENT  PID can cause women to not be able to have children (sterile) if left untreated or if half-treated.  It is important to finish ALL medications given to you.  This is a sexually transmitted infection. So you are also at risk for other sexually transmitted diseases, including HIV (AIDS), it is recommended that you get tested. HOME CARE INSTRUCTIONS  Warning: This infection is contagious. Do not have sex until treatment is completed. Follow up at your caregiver's office or the clinic to which you were referred. If your diagnosis (learning what is wrong) is confirmed by culture or some other method, your recent sexual contacts need treatment. Even if they are symptom free or have a negative culture or evaluation, they should be treated.  PREVENTION  Women should use sanitary pads instead of tampons for vaginal discharge.  Wipe front to back after using the toilet and avoid douching.   Practice safe sex, use condoms, have only one sex partner and be sure your sex partner is not having sex with others.  Ask your caregiver to test you for chlamydia at your  regular checkups or sooner if you are having symptoms.  Ask for further information if you are pregnant.  SEEK IMMEDIATE MEDICAL CARE IF:  You develop an oral temperature above 102 F (38.9 C), not controlled by medications or lasting more than 2 days.  You develop an increase in pain.  You develop any type of abnormal discharge.  You develop vaginal bleeding and it is not time for your period.  You develop painful intercourse.    RESOURCE GUIDE  Dental Problems  Patients with Medicaid: [facility] [phone] [address]                                           [phone] [address] Phone:  [phone]                                                  Phone:  [phone]  If unable to pay or uninsured, contact:  [facility] or [facility]. to become qualified for the adult dental clinic.  Chronic Pain Problems Contact [facility]  [phone] Patients need to be referred by their primary care doctor.  Insufficient Money  for Medicine Contact [employer]:  call "211" or [facility] 236-593-7278.  No Primary Care Doctor Call [facility]  4785908141 Other agencies that provide inexpensive medical care    [facility]  [phone]    [facility]  [phone]    [facility]  ([phone]    [facility]  [phone]    [facility]  [phone]    [facility]  [phone]  Psychological Services [facility]  ([phone] [phone] [facility]  [phone] [facility]   [phone] (emergency services (614) [phone])  Substance Abuse Resources [facility] Drug Services  (332)412-0238 [facility] [phone] [facility] ([phone] Floydene Flock [phone] Residential & Outpatient Substance Abuse Program  (346)277-6280  Abuse/Neglect [facility] Child Abuse Hotline (581)113-9439 [facility] Child Abuse  Hotline 873 292 3174 (After Hours)  Emergency Shelter [facility] (709)616-3121  Maternity Homes [facility] at the [place] [facility] [place] [phone] [facility] Alert Services [phone]  MRSA Hotline #:   (757) 023-6663    [facility] Resources  [facility]     [employer]                          [facility] Dept. [address] [place]                       [address]      [address]  Blondell Reveal Phone:  [phone]                                   Phone:  [phone]                 Phone:  [phone]  [facility] Phone:  [phone]  [facility] Child Abuse Hotline (367) 694-3066 703-708-2582 (After Hours)

## 2015-09-07 NOTE — ED Notes (Signed)
Pt reports he has had a cough for 1 week and on Friday started having burning when he voided.

## 2015-09-08 LAB — GC/CHLAMYDIA PROBE AMP (~~LOC~~) NOT AT ARMC
CHLAMYDIA, DNA PROBE: NEGATIVE
Neisseria Gonorrhea: POSITIVE — AB

## 2015-09-08 LAB — RPR: RPR: NONREACTIVE

## 2015-09-08 LAB — HIV ANTIBODY (ROUTINE TESTING W REFLEX): HIV Screen 4th Generation wRfx: NONREACTIVE

## 2015-09-09 ENCOUNTER — Telehealth (HOSPITAL_BASED_OUTPATIENT_CLINIC_OR_DEPARTMENT_OTHER): Payer: Self-pay | Admitting: Emergency Medicine

## 2015-09-09 NOTE — Telephone Encounter (Signed)
Chart handoff to EDP for treatment plan for Gonorrhea 

## 2015-10-04 ENCOUNTER — Emergency Department (HOSPITAL_COMMUNITY)
Admission: EM | Admit: 2015-10-04 | Discharge: 2015-10-04 | Disposition: A | Discharge status: Home / Self Care | Payer: Self-pay | Attending: Physician Assistant | Admitting: Physician Assistant

## 2015-10-04 ENCOUNTER — Emergency Department (HOSPITAL_COMMUNITY): Payer: Self-pay

## 2015-10-04 ENCOUNTER — Encounter (HOSPITAL_COMMUNITY): Payer: Self-pay | Admitting: Emergency Medicine

## 2015-10-04 DIAGNOSIS — Z720 Tobacco use: Secondary | ICD-10-CM | POA: Insufficient documentation

## 2015-10-04 DIAGNOSIS — X58XXXA Exposure to other specified factors, initial encounter: Secondary | ICD-10-CM | POA: Insufficient documentation

## 2015-10-04 DIAGNOSIS — Y9367 Activity, basketball: Secondary | ICD-10-CM | POA: Insufficient documentation

## 2015-10-04 DIAGNOSIS — Y9289 Other specified places as the place of occurrence of the external cause: Secondary | ICD-10-CM | POA: Insufficient documentation

## 2015-10-04 DIAGNOSIS — I10 Essential (primary) hypertension: Secondary | ICD-10-CM | POA: Insufficient documentation

## 2015-10-04 DIAGNOSIS — Y998 Other external cause status: Secondary | ICD-10-CM | POA: Insufficient documentation

## 2015-10-04 DIAGNOSIS — Z79899 Other long term (current) drug therapy: Secondary | ICD-10-CM | POA: Insufficient documentation

## 2015-10-04 DIAGNOSIS — S99921A Unspecified injury of right foot, initial encounter: Secondary | ICD-10-CM | POA: Insufficient documentation

## 2015-10-04 DIAGNOSIS — M79671 Pain in right foot: Secondary | ICD-10-CM

## 2015-10-04 MED ORDER — IBUPROFEN 800 MG PO TABS: 800.0000 mg | ORAL_TABLET | Freq: Once | ORAL | Status: DC

## 2015-10-04 MED ORDER — ACETAMINOPHEN 325 MG PO TABS
650.0000 mg | ORAL_TABLET | Freq: Once | ORAL | Status: AC
  Administered 2015-10-04: 650 mg via ORAL
  Filled 2015-10-04: qty 2

## 2015-10-04 MED ORDER — IBUPROFEN 800 MG PO TABS: 800.0000 mg | ORAL_TABLET | Freq: Three times a day (TID) | ORAL | Status: DC

## 2015-10-04 NOTE — ED Notes (Signed)
Patient c/o right ankle injury during basketball game yesterday. Describes pain at sore and tight. Denies numbness or tingling. Rates pain 8/10. Pedal pulses intact. 800mg  ibuprofen approximately 1430

## 2015-10-04 NOTE — ED Provider Notes (Signed)
CSN: [id number]     Arrival date & time 10/04/15  1452 History  By signing my name below, I, Walter Salinas, attest that this documentation has been prepared under the direction and in the presence of Glean Hess, [place]. Electronically Signed: Elon Salinas ED Scribe. 10/04/2015. 3:48 PM.    No chief complaint on file.   The history is provided by the patient. No language interpreter was used.    HPI Comments: Walter Salinas is a 24 y.o. male who presents to the Emergency Department complaining of intermittent, right foot pain, which started yesterday s/p injury while playing basketball. He has tried ice and ibuprofen with minimal symptom relief. He reports movement and bearing weight exacerbate his pain. He reports previous injury to his right ankle. He denies numbness, paresthesia, weakness, additional injury.     Past Medical History  Diagnosis Date  . Hypertension    History reviewed. No pertinent past surgical history. Family History  Problem Relation Age of Onset  . Hypertension Mother   . Hypertension Father    Social History  Substance Use Topics  . Smoking status: Current Every Day Smoker -- 1.00 packs/day    Types: Cigarettes  . Smokeless tobacco: Never Used  . Alcohol Use: Yes     Comment: Sometimes    Review of Systems  Musculoskeletal: Positive for arthralgias.  Neurological: Negative for weakness and numbness.      Allergies  Review of patient's allergies indicates no known allergies.  Home Medications   Prior to Admission medications   Medication Sig Start Date End Date Taking? Authorizing Provider  amoxicillin (AMOXIL) 875 MG tablet Take 1 tablet (875 mg total) by mouth 2 (two) times daily. Patient not taking: Reported on 12/27/2014 12/09/14   Abram Sander, MD  amoxicillin-clavulanate (AUGMENTIN) 875-125 MG per tablet Take 1 tablet by mouth 2 (two) times daily. Patient not taking: Reported on 12/27/2014 12/09/14   Abram Sander, MD  benzonatate  (TESSALON) 100 MG capsule Take 1 capsule (100 mg total) by mouth 3 (three) times daily as needed for cough. Patient not taking: Reported on 12/27/2014 08/29/14   [name], NP  hydrochlorothiazide (HYDRODIURIL) 25 MG tablet Take 1 tablet (25 mg total) by mouth daily. 08/02/15   [name], PA-C  ibuprofen (ADVIL,MOTRIN) 800 MG tablet Take 1 tablet (800 mg total) by mouth 3 (three) times daily. 10/04/15   Mady Gemma, PA-C  lidocaine (XYLOCAINE) 2 % solution Use as directed 20 mLs in the mouth or throat as needed for mouth pain. 08/02/15   [name], PA-C  penicillin v potassium (VEETID) 500 MG tablet Take 1 tablet (500 mg total) by mouth 4 (four) times daily. Patient not taking: Reported on 12/27/2014 10/22/14   Earley Favor, NP    There were no vitals taken for this visit. Physical Exam  Constitutional: He is oriented to person, place, and time. He appears well-developed and well-nourished. No distress.  HENT:  Head: Normocephalic and atraumatic.  Right Ear: External ear normal.  Left Ear: External ear normal.  Nose: Nose normal.  Mouth/Throat: Oropharynx is clear and moist.  Eyes: Conjunctivae and EOM are normal. Pupils are equal, round, and reactive to light. Right eye exhibits no discharge. Left eye exhibits no discharge. No scleral icterus.  Neck: Normal range of motion. Neck supple. No tracheal deviation present.  Cardiovascular: Normal rate, regular rhythm and intact distal pulses.   Pulmonary/Chest: Effort normal and breath sounds normal. No respiratory distress.  Musculoskeletal: Normal range  of motion. He exhibits tenderness. He exhibits no edema.  Mild TTP over dorsal aspect of right midfoot. No edema, erythema, or heat. Full range of motion of right foot. Strength and sensation intact. Distal pulses intact.  Neurological: He is alert and oriented to person, place, and time. He has normal strength. No sensory deficit.  Skin: Skin is warm and dry. He is not  diaphoretic.  Psychiatric: He has a normal mood and affect. His behavior is normal.  Nursing note and vitals reviewed.   ED Course  Procedures (including critical care time)  DIAGNOSTIC STUDIES: Oxygen Saturation is 100% on RA, normal by my interpretation.    COORDINATION OF CARE: 3:48 PM Will order ibuprofen and imaging.  Patient acknowledges and agrees with plan.    Labs Review Labs Reviewed - No data to display  Imaging Review Dg Foot Complete Right  10/04/2015  CLINICAL DATA:  Right foot and ankle pain following a basketball injury yesterday. EXAM: RIGHT FOOT COMPLETE - 3+ VIEW COMPARISON:  None. FINDINGS: There is no evidence of fracture or dislocation. There is no evidence of arthropathy or other focal bone abnormality. Soft tissues are unremarkable. IMPRESSION: Normal examination. Electronically Signed   By: Beckie SaltsSteven  Reid M.D.   On: 10/04/2015 16:29   I have personally reviewed and evaluated these images and lab results as part of my medical decision-making.   EKG Interpretation None      MDM   Final diagnoses:  Right foot pain    24 year old male presents with right foot pain s/p playing basketball yesterday. Denies numbness, weakness, paresthesia. Mild TTP over dorsal aspect of right midfoot. No edema, erythema, or heat. Full range of motion of right foot. Strength and sensation intact. Distal pulses intact.   Ibuprofen and ice given for pain. Will obtain x-ray of right foot.  Imaging negative for fracture, dislocation, focal bone abnormality, soft tissue swelling. Patient given post-op shoe and advised to rest, ice, elevate and take ibuprofen for pain and inflammation. Return precautions discussed. Patient to follow-up with PCP, resource list give. Patient verbalizes his understanding and is in agreement with plan.  I personally performed the services described in this documentation, which was scribed in my presence. The recorded information has been reviewed and  is accurate.    Mady Gemmalizabeth C Westfall, PA-C 10/04/15 1711  Courteney Randall AnLyn Mackuen, MD 10/04/15 2122

## 2015-10-04 NOTE — ED Notes (Signed)
Pt states "I don't think I need an xray".

## 2015-10-04 NOTE — Discharge Instructions (Signed)
1. Medications: ibuprofen, usual home medications 2. Treatment: rest, drink plenty of fluids, ice, elevate, wear supportive shoe 3. Follow Up: please followup with your primary doctor for discussion of your diagnoses and further evaluation after today's visit; if you do not have a primary care doctor use the resource guide provided to find one; please return to the ER for severe pain or swelling, numbness, weakness, new or worsening symptoms   Musculoskeletal Pain Musculoskeletal pain is muscle and boney aches and pains. These pains can occur in any part of the body. Your caregiver may treat you without knowing the cause of the pain. They may treat you if blood or urine tests, X-rays, and other tests were normal.  CAUSES There is often not a definite cause or reason for these pains. These pains may be caused by a type of germ (virus). The discomfort may also come from overuse. Overuse includes working out too hard when your body is not fit. Boney aches also come from weather changes. Bone is sensitive to atmospheric pressure changes. HOME CARE INSTRUCTIONS   Ask when your test results will be ready. Make sure you get your test results.  Only take over-the-counter or prescription medicines for pain, discomfort, or fever as directed by your caregiver. If you were given medications for your condition, do not drive, operate machinery or power tools, or sign legal documents for 24 hours. Do not drink alcohol. Do not take sleeping pills or other medications that may interfere with treatment.  Continue all activities unless the activities cause more pain. When the pain lessens, slowly resume normal activities. Gradually increase the intensity and duration of the activities or exercise.  During periods of severe pain, bed rest may be helpful. Lay or sit in any position that is comfortable.  Putting ice on the injured area.  Put ice in a bag.  Place a towel between your skin and the bag.  Leave the  ice on for 15 to 20 minutes, 3 to 4 times a day.  Follow up with your caregiver for continued problems and no reason can be found for the pain. If the pain becomes worse or does not go away, it may be necessary to repeat tests or do additional testing. Your caregiver may need to look further for a possible cause. SEEK IMMEDIATE MEDICAL CARE IF:  You have pain that is getting worse and is not relieved by medications.  You develop chest pain that is associated with shortness or breath, sweating, feeling sick to your stomach (nauseous), or throw up (vomit).  Your pain becomes localized to the abdomen.  You develop any new symptoms that seem different or that concern you. MAKE SURE YOU:   Understand these instructions.  Will watch your condition.  Will get help right away if you are not doing well or get worse.   This information is not intended to replace advice given to you by your health care provider. Make sure you discuss any questions you have with your health care provider.   Document Released: 11/14/2005 Document Revised: 02/06/2012 Document Reviewed: 07/19/2013 Elsevier Interactive Patient Education 2016 [employer].   Emergency Department Resource Guide 1) Find a Doctor and Pay Out of Pocket Although you won't have to find out who is covered by your insurance plan, it is a good idea to ask around and get recommendations. You will then need to call the office and see if the doctor you have chosen will accept you as a new patient and what  types of options they offer for patients who are self-pay. Some doctors offer discounts or will set up payment plans for their patients who do not have insurance, but you will need to ask so you aren't surprised when you get to your appointment.  2) Contact Your Local Health Department Not all health departments have doctors that can see patients for sick visits, but many do, so it is worth a call to see if yours does. If you don't know where  your local health department is, you can check in your phone book. The CDC also has a tool to help you locate your state's health department, and many state websites also have listings of all of their local health departments.  3) Find a Walk-in Clinic If your illness is not likely to be very severe or complicated, you may want to try a walk in clinic. These are popping up all over the country in pharmacies, drugstores, and shopping centers. They're usually staffed by nurse practitioners or physician assistants that have been trained to treat common illnesses and complaints. They're usually fairly quick and inexpensive. However, if you have serious medical issues or chronic medical problems, these are probably not your best option.  No Primary Care Doctor: - Call Health Connect at  [phone] - they can help you locate a primary care doctor that  accepts your insurance, provides certain services, etc. - Physician Referral Service- [phone]  Chronic Pain Problems: Organization         Address  Phone   Notes  [facility]  [phone] Patients need to be referred by their primary care doctor.   Medication Assistance: Organization         Address  Phone   Notes  [facility] Medication [facility] [address] [place], [place] [address] [phone] --Must be a resident of [facility] -- Must have NO insurance coverage whatsoever (no Medicaid/ Medicare, etc.) -- The pt. MUST have a primary care doctor that directs their care regularly and follows them in the community   [facility]  [phone]   [employer]  [phone]    Agencies that provide inexpensive medical care: Organization         Address  Phone   Notes  [facility]  [phone]   [facility]    [phone]   [facility] [address] [place], [place] [address] [phone]   [facility] [address]  [place], [place] [phone]   Planned Parenthood    [phone]   [facility]    [phone]   [facility]  [address], [place] Phone:  [phone], Fax:  [phone] Hours of Operation:  9 am - 6 pm, M-F.  Also accepts Medicaid/Medicare and self-pay.  [facility]  [address], [place] Phone: [phone], Fax: [phone]. Hours of Operation:  8:30 am - 5:30 pm, M-F.  Also accepts Medicaid and self-pay.  [facility] [place] [address] [place] Phone: [phone]   [facility] [address] Natasha Bence [place], [place] [phone], Ext. [phone] Mondays & Thursdays: 7-9 AM.  First 15 patients are seen on a first come, first serve basis.    Medicaid-accepting [facility] Providers:  Retail buyer  Notes  [facility] [address], [place] [phone] Also accepts self-pay patients.  [facility] [address], [place]  [phone]) [phone]   [facility] [address], [place] [phone]) [phone]   [facility] [address] [place] [address], [place] [phone]   Renaye RakersVeita Bland [address], [place]   [phone] Only accepts [place] Access [place] patients after they have their name applied to their card.   Self-Pay (no insurance) in [facility] [place]:  Organization         Address  Phone   Notes  Sickle Cell Patients, [facility] [address], [place] [phone]) [phone]   [facility] [address], [place] [phone]   [facility]  [address], [place] [phone]   [facility]/Dr. Osei-Bonsu  [address] [place] [address], [place] or [phone], [place] [phone]) [phone] Phone number for both [place] and [place] locations is the same.  [facility] [address], [place] [phone]) [phone]   [facility] [address] [place] [address], [place] or [address] [phone]) [phone]   [facility] [address], [place] [phone]) [phone], phone; [phone](336) [phone], fax Sees patients 1st and 3rd Saturday of every month.  Must not qualify for public or private insurance (i.e. Medicaid, Medicare, [place] Health Choice, Veterans' Benefits)  Household income should be no more than 200% of the poverty level The clinic cannot treat you if you are pregnant or think you are pregnant  Sexually transmitted diseases are not treated at the clinic.    Dental Care: Organization         Address  Phone  Notes  [facility] [address], [place] [phone] Accepts children up to age 921 who are enrolled in [place]icaid or [place] Health Choice; pregnant women with a Medicaid card; and children who have applied for Medicaid or [place] Health Choice, but were declined, whose parents can pay a reduced fee at time of service.  [facility] [place]  [address], [place] [phone]) [phone] Accepts children up to age 26 who are enrolled in [place]icaid or [place] Health Choice; pregnant women with a Medicaid card; and children who have applied for Medicaid or [place] Health Choice, but were declined, whose parents can pay a reduced fee at time of service.  [facility]  [address], [place] [phone]) [phone] Patients are seen by appointment only. Walk-ins are not accepted. [facility] will see patients 26 years of age and older. Monday - Tuesday (8am-5pm) Most Wednesdays (8:30-5pm) $30 per visit, cash only  [facility]  [address], [facility] [place] [phone]) [phone] Patients are seen by appointment only. Walk-ins are not  accepted. [facility] will see patients 26 years of age and older. One Wednesday Evening (Monthly: Volunteer Based).  $30 per visit, cash only  [employer] [facility]  [phone] for adults; Children under age 804, call Graduate Pediatric Dentistry at [phone]) [phone]. Children aged 524-14, please call [phone] to request a pediatric application.  Dental services are provided in all areas of dental care including fillings, crowns and bridges, complete  and partial dentures, implants, gum treatment, root canals, and extractions. Preventive care is also provided. Treatment is provided to both adults and children. Patients are selected via a lottery and there is often a waiting list.   [facility] [address], [place]  [phone] [email or web address]   [facility] [address], [place], [place] [phone], Ext. [phone] Second and Fourth Thursday of each month, opens at 6:30 AM; Clinic ends at 9 AM.  Patients are seen on a first-come first-served basis, and a limited number are seen during each clinic.   [facility]  [address], [place], [place] [phone]) [phone]   Eligibility Requirements You must have lived in [place], [place], or [place] counties for at least the last three months.   You cannot be eligible for state or federal sponsored National Cityhealthcare insurance, including CIGNAVeterans Administration, [place]icaid, or [employer]Medicare.   You generally cannot be eligible for healthcare insurance through your employer.    How to apply: Eligibility screenings are held every Tuesday and Wednesday afternoon from 1:00 pm until 4:00 pm. You do not need an appointment for the interview!  [facility] [address], [place], [place] [phone]   [facility]  [phone]   [facility]  [phone]   [facility]  [phone]    Behavioral Health Resources in the  Community: Intensive Outpatient Programs Organization         Address  Phone  Notes  [facility] [address], [place], [place] [phone]   [facility] [address], [place], [place] [phone]   [facility] [address], [place], [place]  [phone]   [facility] [address],  [place], [place] [phone] or [phone]   Substance Abuse Resources Organization         Address  Phone  Notes  [facility]  [phone]   [facility]  [phone]   [facility]  [phone]   [facility]  [phone]   Residential & Outpatient Substance Abuse Program  [phone]   Psychological Services Organization         Address  Phone  Notes  [facility]  [phone]- [phone]   [facility]  [phone]   [facility] [address], [place] [phone] or [phone]    Mobile Crisis Teams Organization         Address  Phone  Notes  [facility], Mobile Crisis Care Unit  [phone]   [facility]  [address] [place], [place] [phone]   Doristine LocksSharon DeEsch [address] [place] [phone]    Self-Help/Support Groups Organization         Address  Phone             Notes  [facility] [place] - variety of support groups  [phone] Call for more information  Narcotics Anonymous (NA), [facility] [address], [place]  2 meetings at this location   Statisticianesidential Treatment Programs Organization         Address  Phone  Notes  [facility] [address],    [place]  [phone]   [facility]  [address], [place] [phone], [place], [place] [phone]   [facility] [address], [place] [phone] Admissions: 8am-3pm M-F  [facility] [address],    [place], [place] [phone]   [facility] [address] [place], [place], [place] [phone]   [facility] [address],  [place], [place] [phone]   [facility] - Intensive Outpatient [address], [place], [place] [phone]   [facility] ([facility] [address] [place]  [place], [place] [phone] or [phone]   [facility] ([facility]) [address], [place], [place] [phone] Accepts Medicaid  [facility] [address]  [place] [phone] Substance Abuse/Addiction Treatment   [facility] Organization         Address  Phone  Notes  [facility]  [phone]   Angie Fava, PhD [address]vin [place], [place]   [phone] or [phone]   [facility]   [address] [place], [place] [phone]   [facility] [address], [place], [place] [phone] Insurance/Medicaid/sponsorship through [facility] and Families [address]                                    [place], [place] [phone] Therapy/tele-psych/case  [facility] [address], [place] [phone]    Dr. Lolly Mustache  [phone]   [facility]  [employer] [facility] Dept. 1) [address], [place] 2) [address] [place] 3)  [address], [place] [phone]  [phone]   [facility] Child Abuse Hotline [phone] or [phone] (After Hours)

## 2015-10-08 ENCOUNTER — Encounter (HOSPITAL_COMMUNITY): Payer: Self-pay | Admitting: Emergency Medicine

## 2015-10-08 ENCOUNTER — Emergency Department (HOSPITAL_COMMUNITY)
Admission: EM | Admit: 2015-10-08 | Discharge: 2015-10-08 | Disposition: A | Discharge status: Home / Self Care | Payer: Self-pay | Attending: Emergency Medicine | Admitting: Emergency Medicine

## 2015-10-08 DIAGNOSIS — I10 Essential (primary) hypertension: Secondary | ICD-10-CM | POA: Insufficient documentation

## 2015-10-08 DIAGNOSIS — Z720 Tobacco use: Secondary | ICD-10-CM | POA: Insufficient documentation

## 2015-10-08 DIAGNOSIS — A64 Unspecified sexually transmitted disease: Secondary | ICD-10-CM | POA: Insufficient documentation

## 2015-10-08 MED ORDER — CEFTRIAXONE SODIUM 250 MG IJ SOLR
250.0000 mg | Freq: Once | INTRAMUSCULAR | Status: AC
  Administered 2015-10-08: 250 mg via INTRAMUSCULAR
  Filled 2015-10-08: qty 250

## 2015-10-08 MED ORDER — AZITHROMYCIN 250 MG PO TABS
1000.0000 mg | ORAL_TABLET | Freq: Once | ORAL | Status: AC
  Administered 2015-10-08: 1000 mg via ORAL
  Filled 2015-10-08: qty 4

## 2015-10-08 NOTE — Discharge Instructions (Signed)
Sexually Transmitted Disease °A sexually transmitted disease (STD) is a disease or infection that may be passed (transmitted) from person to person, usually during sexual activity. This may happen by way of saliva, semen, blood, vaginal mucus, or urine. Common STDs include: °· Gonorrhea. °· Chlamydia. °· Syphilis. °· HIV and AIDS. °· Genital herpes. °· Hepatitis B and C. °· Trichomonas. °· Human papillomavirus (HPV). °· Pubic lice. °· Scabies. °· Mites. °· Bacterial vaginosis. °WHAT ARE CAUSES OF STDs? °An STD may be caused by bacteria, a virus, or parasites. STDs are often transmitted during sexual activity if one person is infected. However, they may also be transmitted through nonsexual means. STDs may be transmitted after:  °· Sexual intercourse with an infected person. °· Sharing sex toys with an infected person. °· Sharing needles with an infected person or using unclean piercing or tattoo needles. °· Having intimate contact with the genitals, mouth, or rectal areas of an infected person. °· Exposure to infected fluids during birth. °WHAT ARE THE SIGNS AND SYMPTOMS OF STDs? °Different STDs have different symptoms. Some people may not have any symptoms. If symptoms are present, they may include: °· Painful or bloody urination. °· Pain in the pelvis, abdomen, vagina, anus, throat, or eyes. °· A skin rash, itching, or irritation. °· Growths, ulcerations, blisters, or sores in the genital and anal areas. °· Abnormal vaginal discharge with or without bad odor. °· Penile discharge in men. °· Fever. °· Pain or bleeding during sexual intercourse. °· Swollen glands in the groin area. °· Yellow skin and eyes (jaundice). This is seen with hepatitis. °· Swollen testicles. °· Infertility. °· Sores and blisters in the mouth. °HOW ARE STDs DIAGNOSED? °To make a diagnosis, your health care provider may: °· Take a medical history. °· Perform a physical exam. °· Take a sample of any discharge to examine. °· Swab the throat,  cervix, opening to the penis, rectum, or vagina for testing. °· Test a sample of your first morning urine. °· Perform blood tests. °· Perform a Pap test, if this applies. °· Perform a colposcopy. °· Perform a laparoscopy. °HOW ARE STDs TREATED? °Treatment depends on the STD. Some STDs may be treated but not cured. °· Chlamydia, gonorrhea, trichomonas, and syphilis can be cured with antibiotic medicine. °· Genital herpes, hepatitis, and HIV can be treated, but not cured, with prescribed medicines. The medicines lessen symptoms. °· Genital warts from HPV can be treated with medicine or by freezing, burning (electrocautery), or surgery. Warts may come back. °· HPV cannot be cured with medicine or surgery. However, abnormal areas may be removed from the cervix, vagina, or vulva. °· If your diagnosis is confirmed, your recent sexual partners need treatment. This is true even if they are symptom-free or have a negative culture or evaluation. They should not have sex until their health care providers say it is okay. °· Your health care provider may test you for infection again 3 months after treatment. °HOW CAN I REDUCE MY RISK OF GETTING AN STD? °Take these steps to reduce your risk of getting an STD: °· Use latex condoms, dental dams, and water-soluble lubricants during sexual activity. Do not use petroleum jelly or oils. °· Avoid having multiple sex partners. °· Do not have sex with someone who has other sex partners °· Do not have sex with anyone you do not know or who is at high risk for an STD. °· Avoid risky sex practices that can break your skin. °· Do not have sex   if you have open sores on your mouth or skin. °· Avoid drinking too much alcohol or taking illegal drugs. Alcohol and drugs can affect your judgment and put you in a vulnerable position. °· Avoid engaging in oral and anal sex acts. °· Get vaccinated for HPV and hepatitis. If you have not received these vaccines in the past, talk to your health care  provider about whether one or both might be right for you. °· If you are at risk of being infected with HIV, it is recommended that you take a prescription medicine daily to prevent HIV infection. This is called pre-exposure prophylaxis (PrEP). You are considered at risk if: °¨ You are a man who has sex with other men (MSM). °¨ You are a heterosexual man or woman and are sexually active with more than one partner. °¨ You take drugs by injection. °¨ You are sexually active with a partner who has HIV. °· Talk with your health care provider about whether you are at high risk of being infected with HIV. If you choose to begin PrEP, you should first be tested for HIV. You should then be tested every 3 months for as long as you are taking PrEP. °WHAT SHOULD I DO IF I THINK I HAVE AN STD? °· See your health care provider. °· Tell your sexual partner(s). They should be tested and treated for any STDs. °· Do not have sex until your health care provider says it is okay. °WHEN SHOULD I GET IMMEDIATE MEDICAL CARE? °Contact your health care provider right away if:  °· You have severe abdominal pain. °· You are a man and notice swelling or pain in your testicles. °· You are a woman and notice swelling or pain in your vagina. °  °This information is not intended to replace advice given to you by your health care provider. Make sure you discuss any questions you have with your health care provider. °  °Document Released: 02/04/2003 Document Revised: 12/05/2014 Document Reviewed: 06/04/2013 °Elsevier Interactive Patient Education ©2016 Else[employer] Inc. ° °

## 2015-10-08 NOTE — ED Notes (Signed)
Patient states he called and spoke with medical records x 2 days ago who advised him that he tested positive for ghonorrhea several weeks ago.   Patient states he needs to be treated.

## 2015-10-08 NOTE — ED Provider Notes (Signed)
CSN: [id number]     Arrival date & time 10/08/15  1122 History  By signing my name below, I, Walter Salinas, attest that this documentation has been prepared under the direction and in the presence of [name], PA-C. Electronically Signed: [name], ED Scribe. 10/08/2015. 11:45 AM.     Chief Complaint  Patient presents with  . SEXUALLY TRANSMITTED DISEASE   The history is provided by the patient. No language interpreter was used.   HPI Comments: Avanell ShackletonGregory A Salinas is a 24 y.o. male with a hx of HTN who presents to the Emergency Department s/p a positive gonorrhea result 2 days ago. Seen in the ED for this several weeks ago but states he never received his results so he called medical records.  He denies any fever, SOB, abdominal pain, penile discharge, rash, or n/v/d. He states that he is here to receive medication for the gonorrhea.  He has notified partner.  Past Medical History  Diagnosis Date  . Hypertension    History reviewed. No pertinent past surgical history. Family History  Problem Relation Age of Onset  . Hypertension Mother   . Hypertension Father    Social History  Substance Use Topics  . Smoking status: Current Every Day Smoker -- 1.00 packs/day    Types: Cigarettes  . Smokeless tobacco: Never Used  . Alcohol Use: Yes     Comment: socially    Review of Systems  Constitutional: Negative for fever.  Respiratory: Negative for shortness of breath.   Gastrointestinal: Negative for nausea, vomiting, abdominal pain and diarrhea.  Genitourinary:       Positive STD test  All other systems reviewed and are negative.     Allergies  Review of patient's allergies indicates no known allergies.  Home Medications   Prior to Admission medications   Medication Sig Start Date End Date Taking? Authorizing Provider  amoxicillin (AMOXIL) 875 MG tablet Take 1 tablet (875 mg total) by mouth 2 (two) times daily. Patient not taking: Reported on 12/27/2014 12/09/14    [name], MD  amoxicillin-clavulanate (AUGMENTIN) 875-125 MG per tablet Take 1 tablet by mouth 2 (two) times daily. Patient not taking: Reported on 12/27/2014 12/09/14   [name], MD  benzonatate (TESSALON) 100 MG capsule Take 1 capsule (100 mg total) by mouth 3 (three) times daily as needed for cough. Patient not taking: Reported on 12/27/2014 08/29/14   [name], NP  hydrochlorothiazide (HYDRODIURIL) 25 MG tablet Take 1 tablet (25 mg total) by mouth daily. 08/02/15   [name], PA-C  ibuprofen (ADVIL,MOTRIN) 800 MG tablet Take 1 tablet (800 mg total) by mouth 3 (three) times daily. 10/04/15   [name], PA-C  lidocaine (XYLOCAINE) 2 % solution Use as directed 20 mLs in the mouth or throat as needed for mouth pain. 08/02/15   [name], PA-C  penicillin v potassium (VEETID) 500 MG tablet Take 1 tablet (500 mg total) by mouth 4 (four) times daily. Patient not taking: Reported on 12/27/2014 10/22/14   [name], NP   BP 159/106 mmHg  Pulse 70  Temp(Src) 98 F (36.7 C) (Oral)  Resp 16  SpO2 100%   Physical Exam  Constitutional: He is oriented to person, place, and time. He appears well-developed and well-nourished.  HENT:  Head: Normocephalic and atraumatic.  Mouth/Throat: Oropharynx is clear and moist.  Eyes: Conjunctivae and EOM are normal. Pupils are equal, round, and reactive to light.  Neck: Normal range of motion.  Cardiovascular: Normal  rate, regular rhythm and normal heart sounds.   Pulmonary/Chest: Effort normal and breath sounds normal.  Abdominal: Soft. Bowel sounds are normal.  Musculoskeletal: Normal range of motion.  Neurological: He is alert and oriented to person, place, and time.  Skin: Skin is warm and dry.  Psychiatric: He has a normal mood and affect.  Nursing note and vitals reviewed.   ED Course  Procedures (including critical care time) DIAGNOSTIC STUDIES: Oxygen Saturation is 100% on RA, normal by my interpretation.     COORDINATION OF CARE: 11:35 AM- Pt advised of plan for treatment and pt agrees. Pt will receive medication for gonorrhea.    Labs Review Labs Reviewed - No data to display  Imaging Review No results found.    EKG Interpretation None      MDM   Final diagnoses:  STD (male)   24 year old male here with STD. He was seen in the ED a few weeks ago for the same, but was not treated. He states he called medical records 2 days ago and was told that he tested positive for gonorrhea. Patient denies any current symptoms. He has notified his partner and she is being treated as well. Patient given Rocephin and azithromycin here in the emergency department. He will follow-up with the health department for further STD needs.  Discussed plan with patient, he/she acknowledged understanding and agreed with plan of care.  Return precautions given for new or worsening symptoms.  I personally performed the services described in this documentation, which was scribed in my presence. The recorded information has been reviewed and is accurate.   [name], PA-C 10/08/15 1158  Marily Memos, MD 10/08/15 [phone]

## 2015-10-09 ENCOUNTER — Telehealth (HOSPITAL_BASED_OUTPATIENT_CLINIC_OR_DEPARTMENT_OTHER): Payer: Self-pay | Admitting: Emergency Medicine

## 2016-01-07 ENCOUNTER — Emergency Department (HOSPITAL_COMMUNITY)
Admission: EM | Admit: 2016-01-07 | Discharge: 2016-01-07 | Disposition: A | Discharge status: Home / Self Care | Payer: Self-pay | Attending: Emergency Medicine | Admitting: Emergency Medicine

## 2016-01-07 ENCOUNTER — Encounter (HOSPITAL_COMMUNITY): Payer: Self-pay | Admitting: Emergency Medicine

## 2016-01-07 DIAGNOSIS — R109 Unspecified abdominal pain: Secondary | ICD-10-CM

## 2016-01-07 DIAGNOSIS — Z79899 Other long term (current) drug therapy: Secondary | ICD-10-CM | POA: Insufficient documentation

## 2016-01-07 DIAGNOSIS — F17210 Nicotine dependence, cigarettes, uncomplicated: Secondary | ICD-10-CM | POA: Insufficient documentation

## 2016-01-07 DIAGNOSIS — I10 Essential (primary) hypertension: Secondary | ICD-10-CM | POA: Insufficient documentation

## 2016-01-07 DIAGNOSIS — R1033 Periumbilical pain: Secondary | ICD-10-CM | POA: Insufficient documentation

## 2016-01-07 LAB — CBC
HEMATOCRIT: 42.1 % (ref 39.0–52.0)
HEMOGLOBIN: 14.7 g/dL (ref 13.0–17.0)
MCH: 28.8 pg (ref 26.0–34.0)
MCHC: 34.9 g/dL (ref 30.0–36.0)
MCV: 82.4 fL (ref 78.0–100.0)
Platelets: 130 10*3/uL — ABNORMAL LOW (ref 150–400)
RBC: 5.11 MIL/uL (ref 4.22–5.81)
RDW: 14.6 % (ref 11.5–15.5)
WBC: 5.5 10*3/uL (ref 4.0–10.5)

## 2016-01-07 LAB — URINALYSIS, ROUTINE W REFLEX MICROSCOPIC
Bilirubin Urine: NEGATIVE
GLUCOSE, UA: NEGATIVE mg/dL
HGB URINE DIPSTICK: NEGATIVE
Ketones, ur: NEGATIVE mg/dL
LEUKOCYTES UA: NEGATIVE
Nitrite: NEGATIVE
PH: 6.5 (ref 5.0–8.0)
PROTEIN: NEGATIVE mg/dL
SPECIFIC GRAVITY, URINE: 1.018 (ref 1.005–1.030)

## 2016-01-07 LAB — COMPREHENSIVE METABOLIC PANEL
ALT: 11 U/L — AB (ref 17–63)
ANION GAP: 10 (ref 5–15)
AST: 18 U/L (ref 15–41)
Albumin: 4.7 g/dL (ref 3.5–5.0)
Alkaline Phosphatase: 56 U/L (ref 38–126)
BUN: 17 mg/dL (ref 6–20)
CHLORIDE: 106 mmol/L (ref 101–111)
CO2: 24 mmol/L (ref 22–32)
CREATININE: 1.08 mg/dL (ref 0.61–1.24)
Calcium: 9.5 mg/dL (ref 8.9–10.3)
Glucose, Bld: 96 mg/dL (ref 65–99)
POTASSIUM: 4.4 mmol/L (ref 3.5–5.1)
SODIUM: 140 mmol/L (ref 135–145)
Total Bilirubin: 0.6 mg/dL (ref 0.3–1.2)
Total Protein: 7.7 g/dL (ref 6.5–8.1)

## 2016-01-07 LAB — LIPASE, BLOOD: LIPASE: 26 U/L (ref 11–51)

## 2016-01-07 MED ORDER — IBUPROFEN 800 MG PO TABS: 800.0000 mg | ORAL_TABLET | Freq: Three times a day (TID) | ORAL | Status: DC

## 2016-01-07 MED ORDER — HYDROCHLOROTHIAZIDE 25 MG PO TABS: 25.0000 mg | ORAL_TABLET | Freq: Every day | ORAL | Status: DC

## 2016-01-07 NOTE — ED Notes (Signed)
Pt c/o periumbilical and epigastric abdominal pain 30 minutes after meals and nausea x 3 weeks.

## 2016-01-07 NOTE — Discharge Instructions (Signed)
1. Medications: ibupofen for pain, HCTZ for high blood pressure, usual home medications 2. Treatment: rest, drink plenty of fluids 3. Follow Up: please followup with your primary doctor for discussion of your diagnoses and further evaluation after today's visit; if you do not have a primary care doctor use the resource guide provided to find one; please return to the ER for increased pain, vomiting, new or worsening symptoms   Abdominal Pain, Adult Many things can cause abdominal pain. Usually, abdominal pain is not caused by a disease and will improve without treatment. It can often be observed and treated at home. Your health care provider will do a physical exam and possibly order blood tests and X-rays to help determine the seriousness of your pain. However, in many cases, more time must pass before a clear cause of the pain can be found. Before that point, your health care provider may not know if you need more testing or further treatment. HOME CARE INSTRUCTIONS Monitor your abdominal pain for any changes. The following actions may help to alleviate any discomfort you are experiencing:  Only take over-the-counter or prescription medicines as directed by your health care provider.  Do not take laxatives unless directed to do so by your health care provider.  Try a clear liquid diet (broth, tea, or water) as directed by your health care provider. Slowly move to a bland diet as tolerated. SEEK MEDICAL CARE IF:  You have unexplained abdominal pain.  You have abdominal pain associated with nausea or diarrhea.  You have pain when you urinate or have a bowel movement.  You experience abdominal pain that wakes you in the night.  You have abdominal pain that is worsened or improved by eating food.  You have abdominal pain that is worsened with eating fatty foods.  You have a fever. SEEK IMMEDIATE MEDICAL CARE IF:  Your pain does not go away within 2 hours.  You keep throwing up  (vomiting).  Your pain is felt only in portions of the abdomen, such as the right side or the left lower portion of the abdomen.  You pass bloody or black tarry stools. MAKE SURE YOU:  Understand these instructions.  Will watch your condition.  Will get help right away if you are not doing well or get worse.   This information is not intended to replace advice given to you by your health care provider. Make sure you discuss any questions you have with your health care provider.   Document Released: 08/24/2005 Document Revised: 08/05/2015 Document Reviewed: 07/24/2013 Elsevier Interactive Patient Education 2016 [employer].   Emergency Department Resource Guide 1) Find a Doctor and Pay Out of Pocket Although you won't have to find out who is covered by your insurance plan, it is a good idea to ask around and get recommendations. You will then need to call the office and see if the doctor you have chosen will accept you as a new patient and what types of options they offer for patients who are self-pay. Some doctors offer discounts or will set up payment plans for their patients who do not have insurance, but you will need to ask so you aren't surprised when you get to your appointment.  2) Contact Your Local Health Department Not all health departments have doctors that can see patients for sick visits, but many do, so it is worth a call to see if yours does. If you don't know where your local health department is, you can check in  your phone book. The CDC also has a tool to help you locate your state's health department, and many state websites also have listings of all of their local health departments.  3) Find a [facility] If your illness is not likely to be very severe or complicated, you may want to try a walk in clinic. These are popping up all over the country in pharmacies, drugstores, and shopping centers. They're usually staffed by nurse practitioners or physician  assistants that have been trained to treat common illnesses and complaints. They're usually fairly quick and inexpensive. However, if you have serious medical issues or chronic medical problems, these are probably not your best option.  No Primary Care Doctor: - Call Health Connect at  [phone] - they can help you locate a primary care doctor that  accepts your insurance, provides certain services, etc. - Physician Referral Service- [phone]  Chronic Pain Problems: Organization         Address  Phone   Notes  [facility]  [phone] Patients need to be referred by their primary care doctor.   Medication Assistance: Organization         Address  Phone   Notes  [facility] Medication [facility] [address] [place], [place] [address] [phone] --Must be a resident of [facility] -- Must have NO insurance coverage whatsoever (no Medicaid/ Medicare, etc.) -- The pt. MUST have a primary care doctor that directs their care regularly and follows them in the community   MedAssist  [phone]   [employer]  [phone]    Agencies that provide inexpensive medical care: Organization         Address  Phone   Notes  [place]  [phone]   [facility]    [phone]   [facility] [place], [place] [address] [phone]   [place] [address] [place] [phone]   Planned Parenthood    [phone]   [facility]    [phone]   [place] and [address], [place] Phone:  [phone], Fax:  [phone] Hours of Operation:  9 am - 6 pm, M-F.  Also accepts Medicaid/Medicare and self-pay.  [facility] for [place], [address], [place] Phone: [phone], Fax: [phone]. Hours of Operation:  8:30 am - 5:30 pm, M-F.  Also accepts  Medicaid and self-pay.  [facility] [address] [place] Phone: [phone]   [place], [place], [place] [phone], Ext. [phone] Mondays & Thursdays: 7-9 AM.  First 15 patients are seen on a first come, first serve basis.    [place] Providers:  Organization         Address  Phone   Notes  [facility] [address], [place] [phone] Also accepts self-pay patients.  [facility] [address], [place]  [phone]   [place], [address], [place] [phone]   [facility] [address] [place], [place] [phone]   Lucianne Lei [address], [place]   [phone] Only accepts [place] Access [place] patients after they have their name applied to their card.  Self-Pay (no insurance) in [facility]:  Organization         Address  Phone   Notes  Sickle Cell Patients, [facility] Internal Medicine [place] [phone]   [facility] Urgent Care Protection [phone]   [facility] [place]  [place], [place], [place] [phone]   [facility]/Dr. Osei-Bonsu  [address] [place] or [address], [place] [phone] Phone number for both [place] and [place] locations is the same.  Urgent Medical and [facility] [address], [place] [phone]   [facility] [address] [place] or [address] [phone]   [facility] [address] [place] [phone], phone; [phone], fax Sees patients 1st and 3rd Saturday of every month.  Must not qualify for public or private insurance (i.e. Medicaid, Medicare, [place] Health Choice, Veterans' Benefits)  Household  income should be no more than 200% of the poverty level The clinic cannot treat you if you are pregnant or think you are pregnant  Sexually transmitted diseases are not treated at the clinic.    Dental Care: Organization         Address  Phone  Notes  [facility] Department of [facility] [place] [phone] Accepts children up to age 36 who are enrolled in [place] or [place]; pregnant women with a Medicaid card; and children who have applied for Medicaid or [place] Health Choice, but were declined, whose parents can pay a reduced fee at time of service.  [facility] Department of [facility]  [address], [place] [phone] Accepts children up to age 23 who are enrolled in [place] or [place]; pregnant women with a Medicaid card; and children who have applied for Medicaid or [place] Health Choice, but were declined, whose parents can pay a reduced fee at time of service.  [place] Adult Dental Access PROGRAM  [place] [phone] Patients are seen by appointment only. Walk-ins are not accepted. [place] will see patients 72 years of age and older. Monday - Tuesday (8am-5pm) Most Wednesdays (8:30-5pm) $30 per visit, cash only  [facility] Adult Dental Access PROGRAM  [address], [facility] [phone] Patients are seen by appointment only. Walk-ins are not accepted. [place] will see patients 85 years of age and older. One Wednesday Evening (Monthly: Volunteer Based).  $30 per visit, cash only  [place]  [phone] for adults; Children under age 67, call Graduate Pediatric Dentistry at [phone]. Children aged 51-14, please call [phone] to request a pediatric application.  Dental services are provided in all areas of dental care including fillings, crowns and bridges, complete and partial dentures, implants, gum  treatment, root canals, and extractions. Preventive care is also provided. Treatment is provided to both adults and children. Patients are selected via a lottery and there is often a waiting list.   [facility] [address], [place]  [phone] [email or web address]   [facility] [address] [place], [place] [phone], Ext. [phone] Second and Fourth Thursday of each month, opens at 6:30 AM; Clinic ends at 9 AM.  Patients are seen on a first-come first-served basis, and a limited number  are seen during each clinic.   [facility]  [address] Hillard Danker [place], [place] [phone]   Eligibility Requirements You must have lived in [place], [place], or [place] counties for at least the last three months.   You cannot be eligible for state or federal sponsored [employer], including [employer], [place], or [employer].   You generally cannot be eligible for healthcare insurance through your employer.    How to apply: Eligibility screenings are held every Tuesday and Wednesday afternoon from 1:00 pm until 4:00 pm. You do not need an appointment for the interview!  [facility] [address] [place], [place], [place]   [place]  [place] Department  [place]  [phone]    Behavioral Health Resources in the Community: Intensive Outpatient Programs Organization         Address  Phone  Notes  [facility] [place]. [address] [place], [place] [phone]   [facility] Outpatient [address] [place], [place]   ADS: Alcohol & Drug Svcs [address], [place], [place]   [place] [address],  [place], [place] or [phone]   Substance Abuse Resources Organization         Address  Phone  Notes  Alcohol and  Drug Services  ([phone]   [place]  [phone]   [facility] [place]   Chinita Pester  [phone]   Residential & Outpatient Substance Abuse Program  [phone]   Psychological Services Organization         Address  Phone  Notes  [facility] [place]  [place]  [phone]   [place] [address] [place] or [phone]    Mobile Crisis Teams Organization         Address  Phone  Notes  [facility], Mobile Crisis Care Unit  [phone]   [facility]ive Psychotherapeutic Services  [address] [place], [place]   [place] [address] [place] [phone]    Self-Help/Support Groups Organization         Address  Phone             Notes  [place]. of [place] - variety of support groups  [place] Call for more information  Narcotics Anonymous (NA), [facility] [address], [employer] [place]  2 meetings at this location   Special educational needs teacher         Address  Phone  Notes  ASAP Residential Treatment [place],    [place]  [phone]   [facility]  [address], [place] [phone], [place], [place]   [place], [address] [phone] Admissions: 8am-3pm M-F  Incentives Substance [place] [address],    [place], [place] [phone]   [facility] [address] Jadene Pierini [place], [place]   [facility] [address]  [place], [place]   [facility]ight Programs - Intensive Outpatient [address], [place], [place]   [facility] ([place].,  [place], [place] or [phone]   [facility]idential Treatment Services (RTS) [address] [place], [place] Accepts Medicaid  Fellowship  [place] [address]  [place] [phone] Substance Abuse/Addiction Treatment   [facility] Organization         Address  Phone  Notes  [facility]  [phone]   Domenic Schwab, PhD [address] Arlis Porta [place], [place]   [phone] or [phone]   [place], [place] [phone]   [facility] [address], [place], [place] [phone] Insurance/Medicaid/sponsorship through [facility] and Families [address]                                    [place], [place] [phone] [place] [address], [place] [phone]    Dr. Adele Schilder  [phone]   [facility] [place] Dept. 1) [address] [place] 2) [place] 3)  [address], [place] [phone]  [phone]   [place] [phone] or [phone] (After Hours)

## 2016-01-07 NOTE — ED Provider Notes (Signed)
CSN: [id number]     Arrival date & time 01/07/16  1241 History   None    Chief Complaint  Patient presents with  . Abdominal Pain    HPI   Walter Salinas is a 25 y.o. male with a PMH of HTN who presents to the ED with abdominal pain, which he states has been intermittent for the past 4 months. He reports his most recent episode started 4 days ago and notes his pain comes and goes. He states he lifts heavy objects often at work, and reports this exacerbates his pain. He has tried "pain pills" at home for symptom relief. He denies fever, chills, N/V/D/C, dysuria, urgency, frequency.   Past Medical History  Diagnosis Date  . Hypertension    History reviewed. No pertinent past surgical history. Family History  Problem Relation Age of Onset  . Hypertension Mother   . Hypertension Father    Social History  Substance Use Topics  . Smoking status: Current Every Day Smoker -- 1.00 packs/day    Types: Cigarettes  . Smokeless tobacco: Never Used  . Alcohol Use: Yes     Comment: socially      Review of Systems  Constitutional: Negative for fever and chills.  Gastrointestinal: Positive for abdominal pain. Negative for nausea, vomiting, diarrhea, constipation and blood in stool.  Genitourinary: Negative for dysuria, urgency and frequency.  All other systems reviewed and are negative.     Allergies  Review of patient's allergies indicates no known allergies.  Home Medications   Prior to Admission medications   Medication Sig Start Date End Date Taking? Authorizing Provider  Multiple Vitamin (MULTIVITAMIN WITH MINERALS) TABS tablet Take 1 tablet by mouth daily.   Yes Historical Provider, MD  hydrochlorothiazide (HYDRODIURIL) 25 MG tablet Take 1 tablet (25 mg total) by mouth daily. 01/07/16   Mady Gemma, PA-C  ibuprofen (ADVIL,MOTRIN) 800 MG tablet Take 1 tablet (800 mg total) by mouth 3 (three) times daily. 01/07/16   Mady Gemma, PA-C  lidocaine (XYLOCAINE) 2 %  solution Use as directed 20 mLs in the mouth or throat as needed for mouth pain. Patient not taking: Reported on 01/07/2016 08/02/15   [name], PA-C    BP 154/109 mmHg  Pulse 65  Temp(Src) 97.5 F (36.4 C) (Oral)  Resp 16  SpO2 100% Physical Exam  Constitutional: He is oriented to person, place, and time. He appears well-developed and well-nourished. No distress.  HENT:  Head: Normocephalic and atraumatic.  Right Ear: External ear normal.  Left Ear: External ear normal.  Nose: Nose normal.  Mouth/Throat: Uvula is midline, oropharynx is clear and moist and mucous membranes are normal.  Eyes: Conjunctivae, EOM and lids are normal. Pupils are equal, round, and reactive to light. Right eye exhibits no discharge. Left eye exhibits no discharge. No scleral icterus.  Neck: Normal range of motion. Neck supple.  Cardiovascular: Normal rate, regular rhythm, normal heart sounds, intact distal pulses and normal pulses.   Pulmonary/Chest: Effort normal and breath sounds normal. No respiratory distress. He has no wheezes. He has no rales.  Abdominal: Soft. Normal appearance and bowel sounds are normal. He exhibits no distension and no mass. There is no tenderness. There is no rigidity, no rebound and no guarding.  No significant TTP, though patient notes periumbilical abdominal pain when he sits up from lying down. No rebound, guarding, or masses.  Musculoskeletal: Normal range of motion. He exhibits no edema or tenderness.  Neurological: He is alert  and oriented to person, place, and time.  Skin: Skin is warm, dry and intact. No rash noted. He is not diaphoretic. No erythema. No pallor.  Psychiatric: He has a normal mood and affect. His speech is normal and behavior is normal.  Nursing note and vitals reviewed.   ED Course  Procedures (including critical care time)  Labs Review Labs Reviewed  COMPREHENSIVE METABOLIC PANEL - Abnormal; Notable for the following:    ALT 11 (*)    All  other components within normal limits  CBC - Abnormal; Notable for the following:    Platelets 130 (*)    All other components within normal limits  URINALYSIS, ROUTINE W REFLEX MICROSCOPIC (NOT AT [facility]) - Abnormal; Notable for the following:    APPearance CLOUDY (*)    All other components within normal limits  LIPASE, BLOOD    Imaging Review No results found.   I have personally reviewed and evaluated these lab results as part of my medical decision-making.   EKG Interpretation None      MDM   Final diagnoses:  Abdominal pain, unspecified abdominal location    25 year old male presents with abdominal pain. Notes lifting heavy objects at work exacerbates his pain. Patient is afebrile. Hypertensive. Abdomen soft, non-tender, non-distended, though patient reports pain when flexing his abdominal muscles when moving from lying to sitting. No rebound, guarding, or masses. CBC negative for leukocytosis or anemia. CMP unremarkable. Lipase within normal limits. UA no evidence of infection. Patient is non-toxic and well-appearing, feel he is stable for discharge at this time. Do not feel imaging is indicated. Symptoms most likely muscular, doubt emergent intra-abdominal pathology given duration of time since symptom onset and no change in character, benign abdominal exam, and reassuring labs. Will give ibuprofen for pain. Patient requesting refill of HCTZ. Patient to follow-up with PCP for further evaluation and management, patient states he is attempting to be seen by family medicine. Return precautions discussed. Patient verbalizes his understanding and is in agreement with plan.  BP 154/109 mmHg  Pulse 65  Temp(Src) 97.5 F (36.4 C) (Oral)  Resp 16  SpO2 100%    Mady Gemma, PA-C 01/08/16 0109  Linwood Dibbles, MD 01/09/16 1226

## 2016-07-06 ENCOUNTER — Encounter (HOSPITAL_COMMUNITY): Payer: Self-pay | Admitting: *Deleted

## 2016-07-06 ENCOUNTER — Emergency Department (HOSPITAL_COMMUNITY)
Admission: EM | Admit: 2016-07-06 | Discharge: 2016-07-06 | Disposition: A | Discharge status: Home / Self Care | Payer: Self-pay | Attending: Emergency Medicine | Admitting: Emergency Medicine

## 2016-07-06 DIAGNOSIS — K644 Residual hemorrhoidal skin tags: Secondary | ICD-10-CM | POA: Insufficient documentation

## 2016-07-06 DIAGNOSIS — I10 Essential (primary) hypertension: Secondary | ICD-10-CM | POA: Insufficient documentation

## 2016-07-06 DIAGNOSIS — F17210 Nicotine dependence, cigarettes, uncomplicated: Secondary | ICD-10-CM | POA: Insufficient documentation

## 2016-07-06 DIAGNOSIS — R1031 Right lower quadrant pain: Secondary | ICD-10-CM

## 2016-07-06 DIAGNOSIS — F1290 Cannabis use, unspecified, uncomplicated: Secondary | ICD-10-CM | POA: Insufficient documentation

## 2016-07-06 LAB — URINALYSIS, ROUTINE W REFLEX MICROSCOPIC
Bilirubin Urine: NEGATIVE
Glucose, UA: NEGATIVE mg/dL
HGB URINE DIPSTICK: NEGATIVE
Ketones, ur: NEGATIVE mg/dL
LEUKOCYTES UA: NEGATIVE
NITRITE: NEGATIVE
PH: 6 (ref 5.0–8.0)
PROTEIN: NEGATIVE mg/dL
SPECIFIC GRAVITY, URINE: 1.019 (ref 1.005–1.030)

## 2016-07-06 MED ORDER — HYDROCORTISONE 2.5 % RE CREA: TOPICAL_CREAM | RECTAL | 0 refills | Status: DC

## 2016-07-06 NOTE — Discharge Instructions (Signed)
Please read and follow all provided instructions.  Your diagnoses today include:  1. Groin pain, right   2. External hemorrhoid     Tests performed today include: Vital signs. See below for your results today.   Medications prescribed:  Take as prescribed   Home care instructions:  Follow any educational materials contained in this packet.  Follow-up instructions: Please follow-up with your primary care provider for further evaluation of symptoms and treatment   Return instructions:  Please return to the Emergency Department if you do not get better, if you get worse, or new symptoms OR  - Fever (temperature greater than 101.3F)  - Bleeding that does not stop with holding pressure to the area    -Severe pain (please note that you may be more sore the day after your accident)  - Chest Pain  - Difficulty breathing  - Severe nausea or vomiting  - Inability to tolerate food and liquids  - Passing out  - Skin becoming red around your wounds  - Change in mental status (confusion or lethargy)  - New numbness or weakness    Please return if you have any other emergent concerns.  Additional Information:  Your vital signs today were: BP 155/99 (BP Location: Right Arm)    Pulse 83    Temp 98.6 F (37 C) (Oral)    Resp 16    SpO2 97%  If your blood pressure (BP) was elevated above 135/85 this visit, please have this repeated by your doctor within one month. --------------

## 2016-07-06 NOTE — ED Notes (Signed)
PT DISCHARGED. INSTRUCTIONS AND PRESCRIPTION GIVEN. AAOX4. PT IN NO APPARENT DISTRESS. THE OPPORTUNITY TO ASK QUESTIONS WAS PROVIDED. 

## 2016-07-06 NOTE — ED Notes (Signed)
EDP looking for pt who isn't in the assigned exam RM 22, following up to find where pt is

## 2016-07-06 NOTE — ED Triage Notes (Signed)
Pt reports R groin pain x 2 days-intermittently.  Pt states pain is worse when sitting down or when playing basketball.  States he would have this sharp shooting pain from his rectum to his scrotum area.  Pt reports hx of STD.  Denies any penile dc at this time but reports dysuria at times.  Pt also reports hemorrhoids.  Pt asked if this pain could be d/t him masturbating a lot or from the hemorrhoids.

## 2016-07-06 NOTE — ED Notes (Signed)
PT LOCATED AND BROUGHT TO RM 22. ERA MADE AWARE.

## 2016-07-06 NOTE — Progress Notes (Signed)
EDCM spoke to patient at bedside. Patient confirms he does not have a pcp or insurance [place].  [facility] with [facility] to [facility], informed patient of services there and walk in times.  EDCM also provided patient with list of pcps who accept self pay patients, list of discount pharmacies and websites needymeds.org and GoodRX.com for medication assistance, phone number to inquire about the orange card, phone number to inquire about Medicaid, phone number to inquire about the Affordable Care Act, financial resources in the community such as local churches, [employer], urban ministries, and dental assistance for uninsured patients.  Patient thankful for resources.  No further EDCM needs at this time.

## 2016-07-06 NOTE — ED Provider Notes (Signed)
[facility]-EMERGENCY DEPT Provider Note   CSN: [id number] Arrival date & time: 07/06/16  1637  First Provider Contact:  First MD Initiated Contact with Patient 07/06/16 1747        History   Chief Complaint Chief Complaint  Patient presents with  . Groin Pain   HPI Walter Salinas is a 25 y.o. male.  HPI 25 y.o. male with a hx of HTN, presents to the Emergency Department today complaining of right groin pain x 6 months. Location of pain is right suprapubic region. States that pain occurs intermittently and only with straining. States that it occurs when lifting heavy objects and with playing basketball. No testicular pain. Pt denies pain currently. No fevers. No N/V. Does endorse occasional dysuria that has been occurring for a few weeks. No discharge. Pt is sexually active and uses protection. Pt also presents for evaluation of hemorrhoids. Notes pain with BMs for several years. No melena or hematochezia. No other symptoms noted.    Past Medical History:  Diagnosis Date  . Hypertension     Patient Active Problem List   Diagnosis Date Noted  . Scabies 01/18/2011  . Exposure to STD 01/18/2011  . SORE THROAT 03/29/2010  . TOBACCO USER 08/15/2009  . SICKLE CELL TRAIT 02/10/2009  . HYPERTENSION, BENIGN SYSTEMIC 01/25/2007  . RHINITIS, ALLERGIC 01/25/2007    History reviewed. No pertinent surgical history.     Home Medications    Prior to Admission medications   Medication Sig Start Date End Date Taking? Authorizing Provider  hydrochlorothiazide (HYDRODIURIL) 25 MG tablet Take 1 tablet (25 mg total) by mouth daily. 01/07/16   Mady Gemma, PA-C  ibuprofen (ADVIL,MOTRIN) 800 MG tablet Take 1 tablet (800 mg total) by mouth 3 (three) times daily. Patient not taking: Reported on 07/06/2016 01/07/16   Mady Gemma, PA-C    Family History Family History  Problem Relation Age of Onset  . Hypertension Mother   . Hypertension Father     Social History Social History    Substance Use Topics  . Smoking status: Current Every Day Smoker    Packs/day: 1.00    Types: Cigarettes  . Smokeless tobacco: Never Used  . Alcohol use Yes     Comment: socially     Allergies   Review of patient's allergies indicates no known allergies.   Review of Systems Review of Systems ROS reviewed and all are negative for acute change except as noted in the HPI.  Physical Exam Updated Vital Signs BP 155/99 (BP Location: Right Arm)   Pulse 83   Temp 98.6 F (37 C) (Oral)   Resp 16   SpO2 97%   Physical Exam  Constitutional: He is oriented to person, place, and time. Vital signs are normal. He appears well-developed and well-nourished.  HENT:  Head: Normocephalic.  Right Ear: Hearing normal.  Left Ear: Hearing normal.  Eyes: Conjunctivae and EOM are normal. Pupils are equal, round, and reactive to light.  Neck: Normal range of motion. Neck supple.  Cardiovascular: Normal rate and regular rhythm.   Pulmonary/Chest: Effort normal.  Genitourinary: Testes normal and penis normal. Rectal exam shows external hemorrhoid. Cremasteric reflex is present. Right testis shows no mass, no swelling and no tenderness. Left testis shows no mass, no swelling and no tenderness. Circumcised. No phimosis, paraphimosis, hypospadias, penile erythema or penile tenderness. No discharge found.  Genitourinary Comments: Right Groin without obvious hernia on cough. Non TTP. No discharge noted on exam. DRE with external hemorrhoids present.  Non thrombosed. TTP. No blood noted.   Neurological: He is alert and oriented to person, place, and time.  Skin: Skin is warm and dry.  Psychiatric: He has a normal mood and affect. His speech is normal and behavior is normal. Thought content normal.   ED Treatments / Results  Labs (all labs ordered are listed, but only abnormal results are displayed) Labs Reviewed  URINALYSIS, ROUTINE W REFLEX MICROSCOPIC (NOT AT [facility])    EKG  EKG  Interpretation None      Radiology No results found.  Procedures Procedures (including critical care time)  Medications Ordered in ED Medications - No data to display   Initial Impression / Assessment and Plan / ED Course  I have reviewed the triage vital signs and the nursing notes.  Pertinent labs & imaging results that were available during my care of the patient were reviewed by me and considered in my medical decision making (see chart for details).  Clinical Course  Value Comment By Time  Leukocytes, UA: NEGATIVE (Reviewed) Audry Piliyler Dajuan Turnley, PA-C 08/09 1835   Final Clinical Impressions(s) / ED Diagnoses  I have reviewed and evaluated the relevant laboratory values I have reviewed the relevant previous healthcare records. I obtained HPI from historian.  ED Course:  Assessment: Pt is a 25yM with hx HTN who presents with right inguinal pain as well as external hemorrhoids x several months. On exam, pt in NAD. Nontoxic/nonseptic appearing. VSS. Afebrile. Abdomen nontender soft. Right inguinal crease without obvious hernia on cough. HPI suggests hernia based on strain pattern and brief episodes. No testicular pain on palpation. No indication for incarcerated hernia. Cremasteric reflex present. No swelling or erythema. Pt also notes occasional dysuria. No discharge. UA unremarkable. DRE showed external hemorrhoids. Pain on palpation. No bleeding. Hemorrhoids not thrombosed. Plan is to DC home with follow up to PCP. Given Anusol cream. At time of discharge, Patient is in no acute distress. Vital Signs are stable. Patient is able to ambulate. Patient able to tolerate PO.    Disposition/Plan:  DC Home Additional Verbal discharge instructions given and discussed with patient.  Pt Instructed to f/u with PCP in the next week for evaluation and treatment of symptoms. Return precautions given Pt acknowledges and agrees with plan  Supervising Physician Geoffery Lyonsouglas Delo, MD   Final diagnoses:   Groin pain, right  External hemorrhoid    New Prescriptions New Prescriptions   No medications on file     Audry Piliyler Venda Dice, PA-C 07/06/16 1847    Geoffery Lyonsouglas Delo, MD 07/06/16 2324

## 2016-09-10 ENCOUNTER — Encounter (HOSPITAL_COMMUNITY): Payer: Self-pay | Admitting: *Deleted

## 2016-09-10 ENCOUNTER — Emergency Department (HOSPITAL_COMMUNITY)
Admission: EM | Admit: 2016-09-10 | Discharge: 2016-09-10 | Disposition: A | Discharge status: Home / Self Care | Payer: Self-pay | Attending: Emergency Medicine | Admitting: Emergency Medicine

## 2016-09-10 DIAGNOSIS — Z79899 Other long term (current) drug therapy: Secondary | ICD-10-CM | POA: Insufficient documentation

## 2016-09-10 DIAGNOSIS — I10 Essential (primary) hypertension: Secondary | ICD-10-CM | POA: Insufficient documentation

## 2016-09-10 DIAGNOSIS — R1013 Epigastric pain: Secondary | ICD-10-CM | POA: Insufficient documentation

## 2016-09-10 DIAGNOSIS — F17210 Nicotine dependence, cigarettes, uncomplicated: Secondary | ICD-10-CM | POA: Insufficient documentation

## 2016-09-10 LAB — LIPASE, BLOOD: Lipase: 16 U/L (ref 11–51)

## 2016-09-10 LAB — URINALYSIS, ROUTINE W REFLEX MICROSCOPIC
BILIRUBIN URINE: NEGATIVE
GLUCOSE, UA: NEGATIVE mg/dL
Hgb urine dipstick: NEGATIVE
KETONES UR: NEGATIVE mg/dL
Leukocytes, UA: NEGATIVE
Nitrite: NEGATIVE
PH: 6 (ref 5.0–8.0)
Protein, ur: NEGATIVE mg/dL
Specific Gravity, Urine: 1.019 (ref 1.005–1.030)

## 2016-09-10 LAB — CBC
HEMATOCRIT: 45.2 % (ref 39.0–52.0)
Hemoglobin: 16.1 g/dL (ref 13.0–17.0)
MCH: 30.3 pg (ref 26.0–34.0)
MCHC: 35.6 g/dL (ref 30.0–36.0)
MCV: 85 fL (ref 78.0–100.0)
Platelets: 144 10*3/uL — ABNORMAL LOW (ref 150–400)
RBC: 5.32 MIL/uL (ref 4.22–5.81)
RDW: 14.8 % (ref 11.5–15.5)
WBC: 6.4 10*3/uL (ref 4.0–10.5)

## 2016-09-10 LAB — COMPREHENSIVE METABOLIC PANEL
ALBUMIN: 4.6 g/dL (ref 3.5–5.0)
ALK PHOS: 64 U/L (ref 38–126)
ALT: 12 U/L — ABNORMAL LOW (ref 17–63)
AST: 21 U/L (ref 15–41)
Anion gap: 7 (ref 5–15)
BILIRUBIN TOTAL: 0.8 mg/dL (ref 0.3–1.2)
BUN: 12 mg/dL (ref 6–20)
CHLORIDE: 105 mmol/L (ref 101–111)
CO2: 25 mmol/L (ref 22–32)
Calcium: 9.2 mg/dL (ref 8.9–10.3)
Creatinine, Ser: 1.11 mg/dL (ref 0.61–1.24)
GFR calc Af Amer: >60 mL/min (ref >60)
GFR calc non Af Amer: >60 mL/min (ref >60)
GLUCOSE: 100 mg/dL — AB (ref 65–99)
POTASSIUM: 4 mmol/L (ref 3.5–5.1)
Sodium: 137 mmol/L (ref 135–145)
Total Protein: 7.9 g/dL (ref 6.5–8.1)

## 2016-09-10 MED ORDER — GI COCKTAIL ~~LOC~~
30.0000 mL | Freq: Once | ORAL | Status: AC
  Administered 2016-09-10: 30 mL via ORAL
  Filled 2016-09-10: qty 30

## 2016-09-10 MED ORDER — HYDROCHLOROTHIAZIDE 25 MG PO TABS: 25.0000 mg | ORAL_TABLET | Freq: Every day | ORAL | 1 refills | Status: DC

## 2016-09-10 MED ORDER — ONDANSETRON 4 MG PO TBDP
4.0000 mg | ORAL_TABLET | Freq: Once | ORAL | Status: AC | PRN
  Administered 2016-09-10: 4 mg via ORAL
  Filled 2016-09-10: qty 1

## 2016-09-10 MED ORDER — FAMOTIDINE 20 MG PO TABS: 20.0000 mg | ORAL_TABLET | Freq: Two times a day (BID) | ORAL | 0 refills | Status: DC

## 2016-09-10 NOTE — ED Triage Notes (Signed)
Pt c/o mid abdominal pain, n/v/d since yesterday. Also, sts his b/p is "sky high" and he is feeling dizzy, not taking his b/p meds "for a long minute, since middle school".

## 2016-09-10 NOTE — ED Provider Notes (Signed)
[facility]-EMERGENCY DEPT Provider Note   CSN: [id number] Arrival date & time: 09/10/16  1124     History   Chief Complaint Chief Complaint  Patient presents with  . Abdominal Pain  . Emesis    HPI Walter Salinas is a 25 y.o. male.  Upper abdominal pain for 2 days with associated nausea.  Pain is worse with eating fatty foods. Some diarrhea. He has hypertension, but is not currently taking medication. No fever, sweats, chills, jaundicing. He has taken nothing at home.      Past Medical History:  Diagnosis Date  . Hypertension     Patient Active Problem List   Diagnosis Date Noted  . Scabies 01/18/2011  . Exposure to STD 01/18/2011  . SORE THROAT 03/29/2010  . TOBACCO USER 08/15/2009  . SICKLE CELL TRAIT 02/10/2009  . HYPERTENSION, BENIGN SYSTEMIC 01/25/2007  . RHINITIS, ALLERGIC 01/25/2007    History reviewed. No pertinent surgical history.     Home Medications    Prior to Admission medications   Medication Sig Start Date End Date Taking? Authorizing Provider  cetirizine (ZYRTEC) 10 MG tablet Take 10 mg by mouth daily as needed for allergies.   Yes Historical Provider, MD  famotidine (PEPCID) 20 MG tablet Take 1 tablet (20 mg total) by mouth 2 (two) times daily. 09/10/16   Donnetta Hutching, MD  hydrochlorothiazide (HYDRODIURIL) 25 MG tablet Take 1 tablet (25 mg total) by mouth daily. 09/10/16   Donnetta Hutching, MD  hydrocortisone (ANUSOL-HC) 2.5 % rectal cream Apply rectally 2 times daily for 7 days Patient not taking: Reported on 09/10/2016 07/06/16   Audry Pili, PA-C    Family History Family History  Problem Relation Age of Onset  . Hypertension Mother   . Hypertension Father     Social History Social History  Substance Use Topics  . Smoking status: Current Every Day Smoker    Packs/day: 1.00    Types: Cigarettes  . Smokeless tobacco: Never Used  . Alcohol use Yes     Comment: socially     Allergies   Review of patient's allergies indicates no known  allergies.   Review of Systems Review of Systems  All other systems reviewed and are negative.    Physical Exam Updated Vital Signs BP (!) 159/111   Pulse 69   Temp 98.3 F (36.8 C) (Oral)   Resp 16   Ht 5\' 11"  (1.803 m)   Wt 151 lb 6.4 oz (68.7 kg)   SpO2 99%   BMI 21.12 kg/m   Physical Exam  Constitutional: He is oriented to person, place, and time. He appears well-developed and well-nourished.  HENT:  Head: Normocephalic and atraumatic.  Eyes: Conjunctivae are normal.  Neck: Neck supple.  Cardiovascular: Normal rate and regular rhythm.   Pulmonary/Chest: Effort normal and breath sounds normal.  Abdominal: Soft. Bowel sounds are normal.  Minimal upper abdominal tenderness.  Musculoskeletal: Normal range of motion.  Neurological: He is alert and oriented to person, place, and time.  Skin: Skin is warm and dry.  Psychiatric: He has a normal mood and affect. His behavior is normal.  Nursing note and vitals reviewed.    ED Treatments / Results  Labs (all labs ordered are listed, but only abnormal results are displayed) Labs Reviewed  COMPREHENSIVE METABOLIC PANEL - Abnormal; Notable for the following:       Result Value   Glucose, Bld 100 (*)    ALT 12 (*)    All other components within normal  limits  CBC - Abnormal; Notable for the following:    Platelets 144 (*)    All other components within normal limits  LIPASE, BLOOD  URINALYSIS, ROUTINE W REFLEX MICROSCOPIC (NOT AT [facility])    EKG  EKG Interpretation None       Radiology No results found.  Procedures Procedures (including critical care time)  Medications Ordered in ED Medications  ondansetron (ZOFRAN-ODT) disintegrating tablet 4 mg (4 mg Oral Given 09/10/16 1319)  gi cocktail (Maalox,Lidocaine,Donnatal) (30 mLs Oral Given 09/10/16 1319)     Initial Impression / Assessment and Plan / ED Course  I have reviewed the triage vital signs and the nursing notes.  Pertinent labs & imaging  results that were available during my care of the patient were reviewed by me and considered in my medical decision making (see chart for details).  Clinical Course    No acute abdomen. Labs are reassuring. Will restart hydrochlorothiazide 25 mg for his blood pressure. Also Rx Pepcid.  Final Clinical Impressions(s) / ED Diagnoses   Final diagnoses:  Epigastric pain  Hypertension, unspecified type    New Prescriptions Discharge Medication List as of 09/10/2016  3:38 PM    START taking these medications   Details  famotidine (PEPCID) 20 MG tablet Take 1 tablet (20 mg total) by mouth 2 (two) times daily., Starting Sat 09/10/2016, Print    hydrochlorothiazide (HYDRODIURIL) 25 MG tablet Take 1 tablet (25 mg total) by mouth daily., Starting Sat 09/10/2016, Print         Donnetta HutchingBrian Ravynn Hogate, MD 09/10/16 [phone]

## 2016-09-10 NOTE — ED Notes (Signed)
He tells me he has had intermittent epigastric pain after eating "especially if I eat a burrito". He is here today with epigastric discomfort and vomited a few times since eating french fries Thursday (two days ago).

## 2016-09-10 NOTE — Discharge Instructions (Signed)
Labs were normal. Avoid rich greasy foods. Prescription for blood pressure medicine and medication for upset stomach. You need to get a primary care doctor.

## 2016-09-25 ENCOUNTER — Encounter (HOSPITAL_COMMUNITY): Payer: Self-pay

## 2016-09-25 ENCOUNTER — Emergency Department (HOSPITAL_COMMUNITY)
Admission: EM | Admit: 2016-09-25 | Discharge: 2016-09-25 | Disposition: A | Discharge status: Home / Self Care | Payer: Self-pay | Attending: Emergency Medicine | Admitting: Emergency Medicine

## 2016-09-25 DIAGNOSIS — J039 Acute tonsillitis, unspecified: Secondary | ICD-10-CM

## 2016-09-25 DIAGNOSIS — I10 Essential (primary) hypertension: Secondary | ICD-10-CM | POA: Insufficient documentation

## 2016-09-25 DIAGNOSIS — F17210 Nicotine dependence, cigarettes, uncomplicated: Secondary | ICD-10-CM | POA: Insufficient documentation

## 2016-09-25 LAB — RAPID STREP SCREEN (MED CTR MEBANE ONLY): STREPTOCOCCUS, GROUP A SCREEN (DIRECT): NEGATIVE

## 2016-09-25 MED ORDER — CLINDAMYCIN HCL 150 MG PO CAPS
300.0000 mg | ORAL_CAPSULE | Freq: Once | ORAL | Status: AC
  Administered 2016-09-25: 300 mg via ORAL
  Filled 2016-09-25: qty 2

## 2016-09-25 MED ORDER — DEXAMETHASONE SODIUM PHOSPHATE 10 MG/ML IJ SOLN
10.0000 mg | Freq: Once | INTRAMUSCULAR | Status: AC
  Administered 2016-09-25: 10 mg via INTRAMUSCULAR
  Filled 2016-09-25: qty 1

## 2016-09-25 MED ORDER — CLINDAMYCIN HCL 150 MG PO CAPS: 150.0000 mg | ORAL_CAPSULE | Freq: Three times a day (TID) | ORAL | 0 refills | Status: DC

## 2016-09-25 NOTE — ED Triage Notes (Signed)
Patient here with 3 days of right tonsil swelling and sore throat, hx of same

## 2016-09-25 NOTE — Discharge Instructions (Signed)
Take antibiotics as prescribed. Take ibuprofen or tylenol as needed for pain. Follow up with ENT if symptoms do not improve. Return to the ED if you experience severe worsening of your symptoms, fevers, difficulty swallowing or tolerating your secretions.

## 2016-09-25 NOTE — ED Triage Notes (Signed)
Patient here with sore throat and right tonsil swelling x 3 days, no fever, NAD

## 2016-09-25 NOTE — ED Provider Notes (Signed)
[facility]-EMERGENCY DEPT Provider Note   CSN: [id number] Arrival date & time: 09/25/16  1355  By signing my name below, I, Doreatha MartinEva Mathews, attest that this documentation has been prepared under the direction and in the presence of Texas InstrumentsSamantha Tripp Dowless, PA-C.  Electronically Signed: Doreatha MartinEva Mathews, ED Scribe. 09/25/16. 2:18 PM.     History   Chief Complaint Chief Complaint  Patient presents with  . Sore Throat    HPI Walter Salinas is a 25 y.o. male who presents to the Emergency Department complaining of moderate, gradually worsening sore throat (R>L) onset 3 days ago. Pt also notes occasional ear pain. Pt states he has taken ibuprofen with no relief of pain. He states his pain is worsened with swallowing. Pt reports frequent h/o strep throat and tonsilitis as a child. He denies fever, cough, difficulty swallowing or tolerating secretions.   The history is provided by the patient. No language interpreter was used.    Past Medical History:  Diagnosis Date  . Hypertension     Patient Active Problem List   Diagnosis Date Noted  . Scabies 01/18/2011  . Exposure to STD 01/18/2011  . SORE THROAT 03/29/2010  . TOBACCO USER 08/15/2009  . SICKLE CELL TRAIT 02/10/2009  . HYPERTENSION, BENIGN SYSTEMIC 01/25/2007  . RHINITIS, ALLERGIC 01/25/2007    History reviewed. No pertinent surgical history.     Home Medications    Prior to Admission medications   Medication Sig Start Date End Date Taking? Authorizing Provider  cetirizine (ZYRTEC) 10 MG tablet Take 10 mg by mouth daily as needed for allergies.    Historical Provider, MD  famotidine (PEPCID) 20 MG tablet Take 1 tablet (20 mg total) by mouth 2 (two) times daily. 09/10/16   Donnetta HutchingBrian Cook, MD  hydrochlorothiazide (HYDRODIURIL) 25 MG tablet Take 1 tablet (25 mg total) by mouth daily. 09/10/16   Donnetta HutchingBrian Cook, MD  hydrocortisone (ANUSOL-HC) 2.5 % rectal cream Apply rectally 2 times daily for 7 days Patient not taking: Reported on 09/10/2016  07/06/16   Audry Piliyler Mohr, PA-C    Family History Family History  Problem Relation Age of Onset  . Hypertension Mother   . Hypertension Father     Social History Social History  Substance Use Topics  . Smoking status: Current Every Day Smoker    Packs/day: 1.00    Types: Cigarettes  . Smokeless tobacco: Never Used  . Alcohol use Yes     Comment: socially     Allergies   Review of patient's allergies indicates no known allergies.   Review of Systems Review of Systems A complete 10 system review of systems was obtained and all systems are negative except as noted in the HPI and PMH.    Physical Exam Updated Vital Signs BP (!) 158/104 (BP Location: Left Arm)   Pulse 68   Temp 97.6 F (36.4 C) (Oral)   Resp 14   SpO2 100%   Physical Exam  Constitutional: He is oriented to person, place, and time. He appears well-developed and well-nourished. No distress.  HENT:  Head: Normocephalic and atraumatic.  Post oropharyngeal erythema present. Small amount of tonsillar exudate on right tonsil. Very minimal swelling anterior to right tonsil without fluctuance. Uvula midline. Pt tolerating secretions.   Eyes: Conjunctivae are normal. Right eye exhibits no discharge. Left eye exhibits no discharge. No scleral icterus.  Cardiovascular: Normal rate.   Pulmonary/Chest: Effort normal.  Neurological: He is alert and oriented to person, place, and time. Coordination normal.  Skin: Skin  is warm and dry. No rash noted. He is not diaphoretic. No erythema. No pallor.  Psychiatric: He has a normal mood and affect. His behavior is normal.  Nursing note and vitals reviewed.    ED Treatments / Results   DIAGNOSTIC STUDIES: Oxygen Saturation is 100% on RA, normal by my interpretation.    COORDINATION OF CARE: 2:16 PM Discussed treatment plan with pt at bedside which includes rapid strep and pt agreed to plan.    Labs (all labs ordered are listed, but only abnormal results are  displayed) Labs Reviewed  RAPID STREP SCREEN (NOT AT [facility])    EKG  EKG Interpretation None       Radiology No results found.  Procedures Procedures (including critical care time)  Medications Ordered in ED Medications  dexamethasone (DECADRON) injection 10 mg (not administered)  clindamycin (CLEOCIN) capsule 300 mg (300 mg Oral Given 09/25/16 1440)     Initial Impression / Assessment and Plan / ED Course  I have reviewed the triage vital signs and the nursing notes.  Pertinent labs & imaging results that were available during my care of the patient were reviewed by me and considered in my medical decision making (see chart for details).  Clinical Course    Otherwise healthy 25 year old male presents to the ED today complaining of sore throat. On presentation ED, he appears well, afebrile in no apparent distress. Pt rapid strep test negative. On exam, there is minimal tonsillar exudate on his right tonsil as well as very mild swelling anterior to his right tonsil. No fluctuance at this time. Pt is tolerating secretions Without difficulty. No obvious peritonsillar abscess at this time however, feel that he may be at risk for developing one. Uvula is midline and airways patent. No cervical lymphadenopathy at this time.  Pt given dose of 10 mg IM Decadron and 300 mg PO Clindamycin in the ED. Pt will be discharged with rx for Clindamycin. Specific return precautions discussed. Recommended ENT follow up. Pt appears safe for discharge.    Final Clinical Impressions(s) / ED Diagnoses   Final diagnoses:  Tonsillitis    New Prescriptions New Prescriptions   No medications on file    I personally performed the services described in this documentation, which was scribed in my presence. The recorded information has been reviewed and is accurate.      [address]amantha Tripp PomonaDowless, PA-C 09/26/16 [id number]    Geoffery Lyonsouglas Delo, MD 09/29/16 [phone]

## 2016-09-25 NOTE — ED Notes (Signed)
Declined W/C at D/C and was escorted to lobby by RN. 

## 2016-09-27 LAB — CULTURE, GROUP A STREP (THRC)

## 2016-10-03 ENCOUNTER — Emergency Department (HOSPITAL_COMMUNITY)
Admission: EM | Admit: 2016-10-03 | Discharge: 2016-10-03 | Disposition: A | Discharge status: Home / Self Care | Payer: Self-pay | Attending: Dermatology | Admitting: Dermatology

## 2016-10-03 ENCOUNTER — Encounter (HOSPITAL_COMMUNITY): Payer: Self-pay | Admitting: Emergency Medicine

## 2016-10-03 DIAGNOSIS — F17210 Nicotine dependence, cigarettes, uncomplicated: Secondary | ICD-10-CM | POA: Insufficient documentation

## 2016-10-03 DIAGNOSIS — I10 Essential (primary) hypertension: Secondary | ICD-10-CM | POA: Insufficient documentation

## 2016-10-03 DIAGNOSIS — M546 Pain in thoracic spine: Secondary | ICD-10-CM | POA: Insufficient documentation

## 2016-10-03 DIAGNOSIS — Z5321 Procedure and treatment not carried out due to patient leaving prior to being seen by health care provider: Secondary | ICD-10-CM | POA: Insufficient documentation

## 2016-10-03 NOTE — ED Triage Notes (Signed)
Pt reports upper back pain after sleeping on floor. No injury nor fall. Pt ambulated to triage without difficulty.

## 2016-10-03 NOTE — ED Notes (Signed)
Pt stated he was leaving and did not want to wait.

## 2016-10-10 ENCOUNTER — Emergency Department (HOSPITAL_COMMUNITY)
Admission: EM | Admit: 2016-10-10 | Discharge: 2016-10-10 | Disposition: A | Discharge status: Home / Self Care | Payer: Self-pay | Attending: Emergency Medicine | Admitting: Emergency Medicine

## 2016-10-10 ENCOUNTER — Encounter (HOSPITAL_COMMUNITY): Payer: Self-pay | Admitting: Emergency Medicine

## 2016-10-10 DIAGNOSIS — F17210 Nicotine dependence, cigarettes, uncomplicated: Secondary | ICD-10-CM | POA: Insufficient documentation

## 2016-10-10 DIAGNOSIS — I10 Essential (primary) hypertension: Secondary | ICD-10-CM | POA: Insufficient documentation

## 2016-10-10 DIAGNOSIS — M62830 Muscle spasm of back: Secondary | ICD-10-CM | POA: Insufficient documentation

## 2016-10-10 DIAGNOSIS — Z79899 Other long term (current) drug therapy: Secondary | ICD-10-CM | POA: Insufficient documentation

## 2016-10-10 LAB — I-STAT CHEM 8, ED
BUN: 11 mg/dL (ref 6–20)
CALCIUM ION: 1.24 mmol/L (ref 1.15–1.40)
CHLORIDE: 104 mmol/L (ref 101–111)
Creatinine, Ser: 1.2 mg/dL (ref 0.61–1.24)
Glucose, Bld: 84 mg/dL (ref 65–99)
HCT: 46 % (ref 39.0–52.0)
Hemoglobin: 15.6 g/dL (ref 13.0–17.0)
Potassium: 4 mmol/L (ref 3.5–5.1)
SODIUM: 141 mmol/L (ref 135–145)
TCO2: 26 mmol/L (ref 0–100)

## 2016-10-10 LAB — URINALYSIS, ROUTINE W REFLEX MICROSCOPIC
BILIRUBIN URINE: NEGATIVE
Glucose, UA: NEGATIVE mg/dL
KETONES UR: 15 mg/dL — AB
NITRITE: NEGATIVE
Protein, ur: NEGATIVE mg/dL
Specific Gravity, Urine: 1.018 (ref 1.005–1.030)
pH: 6 (ref 5.0–8.0)

## 2016-10-10 LAB — URINE MICROSCOPIC-ADD ON

## 2016-10-10 MED ORDER — CYCLOBENZAPRINE HCL 10 MG PO TABS: 10.0000 mg | ORAL_TABLET | Freq: Two times a day (BID) | ORAL | 0 refills | Status: DC | PRN

## 2016-10-10 NOTE — ED Triage Notes (Signed)
Pt from home with c/o lower back pain x 1 week.  Ambulatory, NAD, A&O.

## 2016-10-10 NOTE — ED Provider Notes (Signed)
[facility]PT Provider Note   CSN: [id number] Arrival date & time: 10/10/16  1141  By signing my name below, I, Sonum Patel, attest that this documentation has been prepared under the direction and in the presence of Roxy Horsemanobert Mishayla Sliwinski, PA-C. Electronically Signed: Sonum Patel, Neurosurgeoncribe. 10/10/16. 12:06 PM.  History   Chief Complaint Chief Complaint  Patient presents with  . Back Pain    The history is provided by the patient. No language interpreter was used.     HPI Comments: Walter Salinas is a 25 y.o. male with past medical history of HTN who presents to the Emergency Department complaining of 1 week constant right flank pain with occasional radiation to the right groin. He reports starting a new job and believes this might be related. He reports a history back spasms that usually resolve but states this episode has been unchanged since onset. He denies dysuria, hematuria.    Past Medical History:  Diagnosis Date  . Hypertension     Patient Active Problem List   Diagnosis Date Noted  . Scabies 01/18/2011  . Exposure to STD 01/18/2011  . SORE THROAT 03/29/2010  . TOBACCO USER 08/15/2009  . SICKLE CELL TRAIT 02/10/2009  . HYPERTENSION, BENIGN SYSTEMIC 01/25/2007  . RHINITIS, ALLERGIC 01/25/2007    History reviewed. No pertinent surgical history.     Home Medications    Prior to Admission medications   Medication Sig Start Date End Date Taking? Authorizing Provider  cetirizine (ZYRTEC) 10 MG tablet Take 10 mg by mouth daily as needed for allergies.    Historical Provider, MD  clindamycin (CLEOCIN) 150 MG capsule Take 1 capsule (150 mg total) by mouth 3 (three) times daily. 09/25/16   Samantha Tripp Dowless, PA-C  famotidine (PEPCID) 20 MG tablet Take 1 tablet (20 mg total) by mouth 2 (two) times daily. 09/10/16   Donnetta HutchingBrian Cook, MD  hydrochlorothiazide (HYDRODIURIL) 25 MG tablet Take 1 tablet (25 mg total) by mouth daily. 09/10/16   Donnetta HutchingBrian Cook, MD  hydrocortisone  (ANUSOL-HC) 2.5 % rectal cream Apply rectally 2 times daily for 7 days Patient not taking: Reported on 09/10/2016 07/06/16   Audry Piliyler Mohr, PA-C    Family History Family History  Problem Relation Age of Onset  . Hypertension Mother   . Hypertension Father     Social History Social History  Substance Use Topics  . Smoking status: Current Every Day Smoker    Packs/day: 1.00    Types: Cigarettes  . Smokeless tobacco: Never Used  . Alcohol use Yes     Comment: socially     Allergies   Patient has no known allergies.   Review of Systems Review of Systems  Constitutional: Negative for fever.  Genitourinary: Positive for flank pain and testicular pain. Negative for dysuria and hematuria.     Physical Exam Updated Vital Signs BP 140/91 (BP Location: Left Arm)   Pulse 67   Temp 97.7 F (36.5 C) (Oral)   Resp 18   Ht 5\' 11"  (1.803 m)   Wt 151 lb (68.5 kg)   SpO2 100%   BMI 21.06 kg/m   Physical Exam  Physical Exam  Constitutional: Pt appears well-developed and well-nourished. No distress.  HENT:  Head: Normocephalic and atraumatic.  Mouth/Throat: Oropharynx is clear and moist. No oropharyngeal exudate.  Eyes: Conjunctivae are normal.  Neck: Normal range of motion. Neck supple.  No meningismus Cardiovascular: Normal rate, regular rhythm and intact distal pulses.   Pulmonary/Chest: Effort normal and breath sounds normal.  No respiratory distress. Pt has no wheezes.  Abdominal: Pt exhibits no distension Musculoskeletal:  Right lumbar paraspinal muscles and latissimus dorsi tender to palpation, no bony CTLS spine tenderness, deformity, step-off, or crepitus Lymphadenopathy: Pt has no cervical adenopathy.  Neurological: Pt is alert and oriented Speech is clear and goal oriented, follows commands Normal 5/5 strength in upper and lower extremities bilaterally including dorsiflexion and plantar flexion, strong and equal grip strength Sensation intact Great toe extension  intact Moves extremities without ataxia, coordination intactl  Normal gait Normal balance Skin: Skin is warm and dry. No rash noted. Pt is not diaphoretic. No erythema.  Psychiatric: Pt has a normal mood and affect. Behavior is normal.  Nursing note and vitals reviewed.   ED Treatments / Results  DIAGNOSTIC STUDIES: Oxygen Saturation is 100% on RA, normal by my interpretation.    COORDINATION OF CARE: 12:14 PM Discussed treatment plan with pt at bedside and pt agreed to plan.    Labs (all labs ordered are listed, but only abnormal results are displayed) Labs Reviewed - No data to display  EKG  EKG Interpretation None       Radiology No results found.  Procedures Procedures (including critical care time)  Medications Ordered in ED Medications - No data to display   Initial Impression / Assessment and Plan / ED Course  I have reviewed the triage vital signs and the nursing notes.  Pertinent labs & imaging results that were available during my care of the patient were reviewed by me and considered in my medical decision making (see chart for details).  Clinical Course     Patient with right flank/low back pain. Symptoms started a couple days ago. He states that he is been sleeping on the floor, and is currently waiting for new mattress. He denies any dysuria. Urinalysis shows 6-30 white blood cells. We'll send urine for culture. Patient also admits to having uncontrolled high blood pressure. His blood pressure is not markedly elevated today, only 140/91, advised patient follow-up with this with his primary care provider. Electrolytes and creatinine are normal.  Final Clinical Impressions(s) / ED Diagnoses   Final diagnoses:  Muscle spasm of back    New Prescriptions Discharge Medication List as of 10/10/2016 12:58 PM    START taking these medications   Details  cyclobenzaprine (FLEXERIL) 10 MG tablet Take 1 tablet (10 mg total) by mouth 2 (two) times daily as  needed for muscle spasms., Starting Mon 10/10/2016, Print       I personally performed the services described in this documentation, which was scribed in my presence. The recorded information has been reviewed and is accurate.      Roxy HorsemanRobert Tamica Covell, PA-C 10/10/16 1309    Canary Brimhristopher J Tegeler, MD 10/10/16 2138

## 2016-10-11 LAB — URINE CULTURE

## 2017-08-20 ENCOUNTER — Emergency Department (HOSPITAL_COMMUNITY): Admission: EM | Admit: 2017-08-20 | Discharge: 2017-08-20 | Discharge status: Against Medical Advice | Payer: Self-pay

## 2017-09-27 ENCOUNTER — Emergency Department (HOSPITAL_COMMUNITY): Payer: Self-pay

## 2017-09-27 ENCOUNTER — Encounter (HOSPITAL_COMMUNITY): Payer: Self-pay | Admitting: Emergency Medicine

## 2017-09-27 ENCOUNTER — Emergency Department (HOSPITAL_COMMUNITY)
Admission: EM | Admit: 2017-09-27 | Discharge: 2017-09-27 | Disposition: A | Discharge status: Home / Self Care | Payer: Self-pay | Attending: Emergency Medicine | Admitting: Emergency Medicine

## 2017-09-27 DIAGNOSIS — R6889 Other general symptoms and signs: Secondary | ICD-10-CM

## 2017-09-27 DIAGNOSIS — F17210 Nicotine dependence, cigarettes, uncomplicated: Secondary | ICD-10-CM | POA: Insufficient documentation

## 2017-09-27 DIAGNOSIS — J069 Acute upper respiratory infection, unspecified: Secondary | ICD-10-CM | POA: Insufficient documentation

## 2017-09-27 DIAGNOSIS — Z79899 Other long term (current) drug therapy: Secondary | ICD-10-CM | POA: Insufficient documentation

## 2017-09-27 DIAGNOSIS — I10 Essential (primary) hypertension: Secondary | ICD-10-CM | POA: Insufficient documentation

## 2017-09-27 LAB — CBC WITH DIFFERENTIAL/PLATELET
Basophils Absolute: 0 10*3/uL (ref 0.0–0.1)
Basophils Relative: 0 %
EOS PCT: 0 %
Eosinophils Absolute: 0 10*3/uL (ref 0.0–0.7)
HEMATOCRIT: 42.9 % (ref 39.0–52.0)
Hemoglobin: 15.2 g/dL (ref 13.0–17.0)
LYMPHS PCT: 5 %
Lymphs Abs: 0.8 10*3/uL (ref 0.7–4.0)
MCH: 29.1 pg (ref 26.0–34.0)
MCHC: 35.4 g/dL (ref 30.0–36.0)
MCV: 82.2 fL (ref 78.0–100.0)
MONO ABS: 0.8 10*3/uL (ref 0.1–1.0)
MONOS PCT: 5 %
NEUTROS ABS: 12.6 10*3/uL — AB (ref 1.7–7.7)
Neutrophils Relative %: 90 %
PLATELETS: 155 10*3/uL (ref 150–400)
RBC: 5.22 MIL/uL (ref 4.22–5.81)
RDW: 14.5 % (ref 11.5–15.5)
WBC: 14.1 10*3/uL — ABNORMAL HIGH (ref 4.0–10.5)

## 2017-09-27 LAB — COMPREHENSIVE METABOLIC PANEL
ALBUMIN: 4.3 g/dL (ref 3.5–5.0)
ALT: 19 U/L (ref 17–63)
AST: 18 U/L (ref 15–41)
Alkaline Phosphatase: 66 U/L (ref 38–126)
Anion gap: 9 (ref 5–15)
BILIRUBIN TOTAL: 0.7 mg/dL (ref 0.3–1.2)
BUN: 11 mg/dL (ref 6–20)
CHLORIDE: 105 mmol/L (ref 101–111)
CO2: 21 mmol/L — ABNORMAL LOW (ref 22–32)
CREATININE: 1.14 mg/dL (ref 0.61–1.24)
Calcium: 9.3 mg/dL (ref 8.9–10.3)
GFR calc Af Amer: >60 mL/min (ref >60)
GLUCOSE: 114 mg/dL — AB (ref 65–99)
POTASSIUM: 3.7 mmol/L (ref 3.5–5.1)
Sodium: 135 mmol/L (ref 135–145)
TOTAL PROTEIN: 7.5 g/dL (ref 6.5–8.1)

## 2017-09-27 LAB — URINALYSIS, ROUTINE W REFLEX MICROSCOPIC
Bilirubin Urine: NEGATIVE
Glucose, UA: NEGATIVE mg/dL
Hgb urine dipstick: NEGATIVE
KETONES UR: NEGATIVE mg/dL
LEUKOCYTES UA: NEGATIVE
NITRITE: NEGATIVE
PH: 6 (ref 5.0–8.0)
PROTEIN: NEGATIVE mg/dL
Specific Gravity, Urine: 1.013 (ref 1.005–1.030)

## 2017-09-27 LAB — I-STAT CG4 LACTIC ACID, ED: Lactic Acid, Venous: 1.02 mmol/L (ref 0.5–1.9)

## 2017-09-27 MED ORDER — ACETAMINOPHEN 325 MG PO TABS
ORAL_TABLET | ORAL | Status: AC
  Filled 2017-09-27: qty 2

## 2017-09-27 MED ORDER — ACETAMINOPHEN 325 MG PO TABS
650.0000 mg | ORAL_TABLET | Freq: Once | ORAL | Status: AC
  Administered 2017-09-27: 650 mg via ORAL

## 2017-09-27 NOTE — Discharge Instructions (Signed)
Your labs and chest x-ray are not showing any sign of a bacterial infection. You can continue to treat the symptoms with over-the-counter medications for relief: Tylenol and/or ibuprofen for fever and body aches; plain saline nasal spray; decongestants. If symptoms do not improve in another 2-3 days, follow up with Urgent Care for further evaluation.

## 2017-09-27 NOTE — ED Triage Notes (Signed)
Pt having 10/10 generalized body ache, with fever, chills, nausea and not able to tolerate food. No getting better with OTC medication.

## 2017-09-27 NOTE — ED Provider Notes (Signed)
[facility] EMERGENCY DEPARTMENT Provider Note   CSN: [id number] Arrival date & time: 09/27/17  0015     History   Chief Complaint Chief Complaint  Patient presents with  . Influenza    HPI Walter Salinas is a 26 y.o. male.  Patient here for evaluation of significant generalized body aches, cough, fever, congestion and sore throat x 2 days, worse today. Nausea without vomiting. He is maintaining fluid intake but reports he is not eating. His cough is productive. No sick contacts that he is aware of. He has tried OTC cold medications without improvement.    The history is provided by the patient. No language interpreter was used.  Influenza  Presenting symptoms: cough, fatigue, fever, myalgias, nausea and sore throat   Presenting symptoms: no shortness of breath   Severity:  Moderate Onset quality:  Gradual Duration:  2 days Progression:  Worsening Chronicity:  New Relieved by:  Nothing Worsened by:  Nothing Ineffective treatments:  OTC medications and drinking Associated symptoms: chills, decreased appetite and nasal congestion   Associated symptoms: no neck stiffness     Past Medical History:  Diagnosis Date  . Hypertension     Patient Active Problem List   Diagnosis Date Noted  . Scabies 01/18/2011  . Exposure to STD 01/18/2011  . SORE THROAT 03/29/2010  . TOBACCO USER 08/15/2009  . SICKLE CELL TRAIT 02/10/2009  . HYPERTENSION, BENIGN SYSTEMIC 01/25/2007  . RHINITIS, ALLERGIC 01/25/2007    History reviewed. No pertinent surgical history.     Home Medications    Prior to Admission medications   Medication Sig Start Date End Date Taking? Authorizing Provider  cetirizine (ZYRTEC) 10 MG tablet Take 10 mg by mouth daily as needed for allergies.    [provider]  clindamycin (CLEOCIN) 150 MG capsule Take 1 capsule (150 mg total) by mouth 3 (three) times daily. 09/25/16   Dowless, Lelon Mast Tripp, PA-C  cyclobenzaprine  (FLEXERIL) 10 MG tablet Take 1 tablet (10 mg total) by mouth 2 (two) times daily as needed for muscle spasms. 10/10/16   Roxy Horseman, PA-C  famotidine (PEPCID) 20 MG tablet Take 1 tablet (20 mg total) by mouth 2 (two) times daily. 09/10/16   Donnetta Hutching, MD  hydrochlorothiazide (HYDRODIURIL) 25 MG tablet Take 1 tablet (25 mg total) by mouth daily. 09/10/16   Donnetta Hutching, MD  hydrocortisone (ANUSOL-HC) 2.5 % rectal cream Apply rectally 2 times daily for 7 days Patient not taking: Reported on 09/10/2016 07/06/16   Audry Pili, PA-C    Family History Family History  Problem Relation Age of Onset  . Hypertension Mother   . Hypertension Father     Social History Social History  Substance Use Topics  . Smoking status: Current Every Day Smoker    Packs/day: 1.00    Types: Cigarettes  . Smokeless tobacco: Never Used  . Alcohol use Yes     Comment: socially     Allergies   Patient has no known allergies.   Review of Systems Review of Systems  Constitutional: Positive for chills, decreased appetite, fatigue and fever.  HENT: Positive for congestion and sore throat.   Respiratory: Positive for cough. Negative for shortness of breath.   Cardiovascular: Negative.   Gastrointestinal: Positive for nausea.  Musculoskeletal: Positive for myalgias. Negative for neck stiffness.  Skin: Negative.   Neurological: Negative.      Physical Exam Updated Vital Signs BP (!) 179/100   Pulse 93   Temp 99.2 F (  37.3 C) (Oral)   Resp 17   Ht 5\' 11"  (1.803 m)   Wt 68.5 kg (151 lb)   SpO2 98%   BMI 21.06 kg/m   Physical Exam  Constitutional: He is oriented to person, place, and time. He appears well-developed and well-nourished. No distress.  HENT:  Head: Normocephalic.  Mouth/Throat: Oropharynx is clear and moist.  Eyes: Conjunctivae are normal.  Neck: Normal range of motion. Neck supple.  Cardiovascular: Normal rate and regular rhythm.   No murmur heard. Pulmonary/Chest: Effort  normal and breath sounds normal. He has no wheezes. He has no rales.  Abdominal: Soft. Bowel sounds are normal. There is no tenderness. There is no rebound and no guarding.  Musculoskeletal: Normal range of motion.  Neurological: He is alert and oriented to person, place, and time.  Skin: Skin is warm and dry. No rash noted.  Psychiatric: He has a normal mood and affect.     ED Treatments / Results  Labs (all labs ordered are listed, but only abnormal results are displayed) Labs Reviewed  COMPREHENSIVE METABOLIC PANEL - Abnormal; Notable for the following:       Result Value   CO2 21 (*)    Glucose, Bld 114 (*)    All other components within normal limits  CBC WITH DIFFERENTIAL/PLATELET - Abnormal; Notable for the following:    WBC 14.1 (*)    Neutro Abs 12.6 (*)    All other components within normal limits  URINALYSIS, ROUTINE W REFLEX MICROSCOPIC  I-STAT CG4 LACTIC ACID, ED  I-STAT CG4 LACTIC ACID, ED   Results for orders placed or performed during the hospital encounter of 09/27/17  Comprehensive metabolic panel  Result Value Ref Range   Sodium 135 135 - 145 mmol/L   Potassium 3.7 3.5 - 5.1 mmol/L   Chloride 105 101 - 111 mmol/L   CO2 21 (L) 22 - 32 mmol/L   Glucose, Bld 114 (H) 65 - 99 mg/dL   BUN 11 6 - 20 mg/dL   Creatinine, Ser 1.61 0.61 - 1.24 mg/dL   Calcium 9.3 8.9 - 09.6 mg/dL   Total Protein 7.5 6.5 - 8.1 g/dL   Albumin 4.3 3.5 - 5.0 g/dL   AST 18 15 - 41 U/L   ALT 19 17 - 63 U/L   Alkaline Phosphatase 66 38 - 126 U/L   Total Bilirubin 0.7 0.3 - 1.2 mg/dL   GFR calc non Af Amer >60 >60 mL/min   GFR calc Af Amer >60 >60 mL/min   Anion gap 9 5 - 15  CBC with Differential  Result Value Ref Range   WBC 14.1 (H) 4.0 - 10.5 K/uL   RBC 5.22 4.22 - 5.81 MIL/uL   Hemoglobin 15.2 13.0 - 17.0 g/dL   HCT 04.5 40.9 - 81.1 %   MCV 82.2 78.0 - 100.0 fL   MCH 29.1 26.0 - 34.0 pg   MCHC 35.4 30.0 - 36.0 g/dL   RDW 91.4 78.2 - 95.6 %   Platelets 155 150 - 400 K/uL    Neutrophils Relative % 90 %   Neutro Abs 12.6 (H) 1.7 - 7.7 K/uL   Lymphocytes Relative 5 %   Lymphs Abs 0.8 0.7 - 4.0 K/uL   Monocytes Relative 5 %   Monocytes Absolute 0.8 0.1 - 1.0 K/uL   Eosinophils Relative 0 %   Eosinophils Absolute 0.0 0.0 - 0.7 K/uL   Basophils Relative 0 %   Basophils Absolute 0.0 0.0 - 0.1 K/uL  Urinalysis, Routine w reflex microscopic  Result Value Ref Range   Color, Urine YELLOW YELLOW   APPearance CLEAR CLEAR   Specific Gravity, Urine 1.013 1.005 - 1.030   pH 6.0 5.0 - 8.0   Glucose, UA NEGATIVE NEGATIVE mg/dL   Hgb urine dipstick NEGATIVE NEGATIVE   Bilirubin Urine NEGATIVE NEGATIVE   Ketones, ur NEGATIVE NEGATIVE mg/dL   Protein, ur NEGATIVE NEGATIVE mg/dL   Nitrite NEGATIVE NEGATIVE   Leukocytes, UA NEGATIVE NEGATIVE  I-Stat CG4 Lactic Acid, ED  Result Value Ref Range   Lactic Acid, Venous 1.02 0.5 - 1.9 mmol/L     EKG  EKG Interpretation None       Radiology No results found. Dg Chest 2 View  Result Date: 09/27/2017 CLINICAL DATA:  Cough and fever. EXAM: CHEST  2 VIEW COMPARISON:  09/07/2015 FINDINGS: The cardiomediastinal contours are normal. The lungs are clear. Pulmonary vasculature is normal. No consolidation, pleural effusion, or pneumothorax. No acute osseous abnormalities are seen. IMPRESSION: No active cardiopulmonary disease. Electronically Signed   By: Rubye OaksMelanie  Ehinger M.D.   On: 09/27/2017 04:53    Procedures Procedures (including critical care time)  Medications Ordered in ED Medications  acetaminophen (TYLENOL) tablet 650 mg (650 mg Oral Given 09/27/17 0100)     Initial Impression / Assessment and Plan / ED Course  I have reviewed the triage vital signs and the nursing notes.  Pertinent labs & imaging results that were available during my care of the patient were reviewed by me and considered in my medical decision making (see chart for details).     Patient with URI symptoms of cough, congestion, sore throat,  fever as well as generalized body aches and nausea. No sick contacts.   He is well appearing, nontoxic. VSS. Elevated blood pressure with history of HTN. Lab studies reassuring. CXR pending.   CXR negative for PNA. Likely viral etiology, possible flu. Will recommend supportive care - fluids, treat any fever, cold remedies and follow up if symptoms worsen.   Final Clinical Impressions(s) / ED Diagnoses   Final diagnoses:  None   1. URI  New Prescriptions New Prescriptions   No medications on file     Elpidio AnisUpstill, Halleigh Comes, PA-C 09/27/17 0500    Glynn Octaveancour, Stephen, MD 09/27/17 [phone]

## 2018-08-22 ENCOUNTER — Encounter (HOSPITAL_COMMUNITY): Payer: Self-pay

## 2018-08-22 ENCOUNTER — Other Ambulatory Visit: Payer: Self-pay

## 2018-08-22 ENCOUNTER — Emergency Department (HOSPITAL_COMMUNITY)
Admission: EM | Admit: 2018-08-22 | Discharge: 2018-08-22 | Discharge status: Against Medical Advice | Payer: Self-pay | Attending: Emergency Medicine | Admitting: Emergency Medicine

## 2018-08-22 DIAGNOSIS — Z5321 Procedure and treatment not carried out due to patient leaving prior to being seen by health care provider: Secondary | ICD-10-CM | POA: Insufficient documentation

## 2018-08-22 NOTE — ED Triage Notes (Addendum)
Pt reports intermittent pain in his perineum x1 year. Denies discharge or difficulty urinating. A&Ox4.

## 2018-11-07 ENCOUNTER — Other Ambulatory Visit: Payer: Self-pay

## 2018-11-07 ENCOUNTER — Emergency Department (HOSPITAL_COMMUNITY): Payer: Self-pay

## 2018-11-07 ENCOUNTER — Emergency Department (HOSPITAL_COMMUNITY)
Admission: EM | Admit: 2018-11-07 | Discharge: 2018-11-07 | Disposition: A | Discharge status: Home / Self Care | Payer: Self-pay | Attending: Emergency Medicine | Admitting: Emergency Medicine

## 2018-11-07 DIAGNOSIS — Z79899 Other long term (current) drug therapy: Secondary | ICD-10-CM | POA: Insufficient documentation

## 2018-11-07 DIAGNOSIS — I10 Essential (primary) hypertension: Secondary | ICD-10-CM | POA: Insufficient documentation

## 2018-11-07 DIAGNOSIS — J069 Acute upper respiratory infection, unspecified: Secondary | ICD-10-CM | POA: Insufficient documentation

## 2018-11-07 DIAGNOSIS — B9789 Other viral agents as the cause of diseases classified elsewhere: Secondary | ICD-10-CM

## 2018-11-07 DIAGNOSIS — F17210 Nicotine dependence, cigarettes, uncomplicated: Secondary | ICD-10-CM | POA: Insufficient documentation

## 2018-11-07 MED ORDER — GUAIFENESIN-CODEINE 100-10 MG/5ML PO SOLN: 5.0000 mL | Freq: Four times a day (QID) | ORAL | 0 refills | Status: DC | PRN

## 2018-11-07 MED ORDER — BENZONATATE 100 MG PO CAPS: 200.0000 mg | ORAL_CAPSULE | Freq: Three times a day (TID) | ORAL | 0 refills | Status: DC

## 2018-11-07 NOTE — ED Provider Notes (Addendum)
[facility] EMERGENCY DEPARTMENT Provider Note   CSN: [id number] Arrival date & time: 11/07/18  2043     History   Chief Complaint Chief Complaint  Patient presents with  . Cough    HPI Walter Salinas is a 27 y.o. male with a past medical history of hypertension, tobacco abuse who presents to ED for 3-day history of dry, hacking cough.  States that he had one episode of blood-tinged sputum prior to his arrival here.  He states that he had sick contacts at work with similar symptoms and he may have "caught what they have."  He has been taking Robitussin-DM with no improvement in his symptoms.  He denies any chest pain, shortness of breath, wheezing, fever, rhinorrhea, recent immobilization, history of cancer, history of DVT or PE.  HPI  Past Medical History:  Diagnosis Date  . Hypertension     Patient Active Problem List   Diagnosis Date Noted  . Scabies 01/18/2011  . Exposure to STD 01/18/2011  . SORE THROAT 03/29/2010  . TOBACCO USER 08/15/2009  . SICKLE CELL TRAIT 02/10/2009  . HYPERTENSION, BENIGN SYSTEMIC 01/25/2007  . RHINITIS, ALLERGIC 01/25/2007    No past surgical history on file.      Home Medications    Prior to Admission medications   Medication Sig Start Date End Date Taking? Authorizing Provider  benzonatate (TESSALON) 100 MG capsule Take 2 capsules (200 mg total) by mouth every 8 (eight) hours. 11/07/18   Symiah Nowotny, PA-C  cetirizine (ZYRTEC) 10 MG tablet Take 10 mg by mouth daily as needed for allergies.    [provider]  clindamycin (CLEOCIN) 150 MG capsule Take 1 capsule (150 mg total) by mouth 3 (three) times daily. 09/25/16   Dowless, Lelon Mast Tripp, PA-C  cyclobenzaprine (FLEXERIL) 10 MG tablet Take 1 tablet (10 mg total) by mouth 2 (two) times daily as needed for muscle spasms. 10/10/16   Roxy Horseman, PA-C  famotidine (PEPCID) 20 MG tablet Take 1 tablet (20 mg total) by mouth 2 (two) times daily. 09/10/16    Donnetta Hutching, MD  guaiFENesin-codeine 100-10 MG/5ML syrup Take 5 mLs by mouth every 6 (six) hours as needed for cough. 11/07/18   Bach Rocchi, PA-C  hydrochlorothiazide (HYDRODIURIL) 25 MG tablet Take 1 tablet (25 mg total) by mouth daily. 09/10/16   Donnetta Hutching, MD  hydrocortisone (ANUSOL-HC) 2.5 % rectal cream Apply rectally 2 times daily for 7 days Patient not taking: Reported on 09/10/2016 07/06/16   Audry Pili, PA-C    Family History Family History  Problem Relation Age of Onset  . Hypertension Mother   . Hypertension Father     Social History Social History   Tobacco Use  . Smoking status: Current Every Day Smoker    Packs/day: 1.00    Types: Cigarettes  . Smokeless tobacco: Never Used  Substance Use Topics  . Alcohol use: Yes    Comment: socially  . Drug use: Yes    Frequency: 7.0 times per week    Types: Marijuana    Comment: daily use     Allergies   Patient has no known allergies.   Review of Systems Review of Systems  Constitutional: Negative for chills and fever.  HENT: Negative for congestion.   Respiratory: Positive for cough. Negative for shortness of breath.   Cardiovascular: Negative for chest pain.  Gastrointestinal: Negative for vomiting.     Physical Exam Updated Vital Signs BP (!) 164/85 (BP Location: Right Arm)  Pulse 81   Temp 98.1 F (36.7 C) (Oral)   Resp 18   Ht 5\' 11"  (1.803 m)   Wt 79.4 kg   SpO2 97%   BMI 24.41 kg/m   Physical Exam  Constitutional: He appears well-developed and well-nourished. No distress.  Dry cough noted on exam.  HENT:  Head: Normocephalic and atraumatic.  Eyes: Conjunctivae and EOM are normal. No scleral icterus.  Neck: Normal range of motion.  Cardiovascular: Normal rate, regular rhythm and normal heart sounds.  Pulmonary/Chest: Effort normal and breath sounds normal. No respiratory distress.  Neurological: He is alert.  Skin: No rash noted. He is not diaphoretic.  Psychiatric: He has a normal  mood and affect.  Nursing note and vitals reviewed.    ED Treatments / Results  Labs (all labs ordered are listed, but only abnormal results are displayed) Labs Reviewed - No data to display  EKG None  Radiology Dg Chest 2 View  Result Date: 11/07/2018 CLINICAL DATA:  Cough EXAM: CHEST - 2 VIEW COMPARISON:  09/27/2017 FINDINGS: The heart size and mediastinal contours are within normal limits. Both lungs are clear. The visualized skeletal structures are unremarkable. IMPRESSION: No active cardiopulmonary disease. Electronically Signed   By: Jasmine Pang M.D.   On: 11/07/2018 21:59    Procedures Procedures (including critical care time)  Medications Ordered in ED Medications - No data to display   Initial Impression / Assessment and Plan / ED Course  I have reviewed the triage vital signs and the nursing notes.  Pertinent labs & imaging results that were available during my care of the patient were reviewed by me and considered in my medical decision making (see chart for details).     27 year old male presents to ED for 3-day history of dry, hacking cough with one episode of blood-tinged sputum prior to arrival.  He states that he has had sick contacts at work with similar symptoms.  No improvement noted with Robitussin-DM.  He denies any chest pain, shortness of breath, wheezing, recent immobilization, history of DVT or PE, leg swelling.  On exam patient is overall well-appearing.  Lungs are clear to auscultation bilaterally.  Oxygen saturations are 97 to 99% on room air.  He is not tachycardic or tachypneic.  No increased work of breathing noted.  Chest x-ray shows no acute abnormalities.  Suspect that symptoms are viral in nature.  Doubt PE as the cause of his symptoms as his vital signs are within normal limits, he has no risk factors including cancer, recent immobilization or prior PE.  The patient is comfortable with supportive treatment at home.  He does know to return for  any severe worsening symptoms. [place] PMP queried with no discrepancies.  Patient is hemodynamically stable, in NAD, and able to ambulate in the ED. Evaluation does not show pathology that would require ongoing emergent intervention or inpatient treatment. I explained the diagnosis to the patient. Pain has been managed and has no complaints prior to discharge. Patient is comfortable with above plan and is stable for discharge at this time. All questions were answered prior to disposition. Strict return precautions for returning to the ED were discussed. Encouraged follow up with PCP.    Portions of this note were generated with Scientist, clinical (histocompatibility and immunogenetics). Dictation errors may occur despite best attempts at proofreading.   Final Clinical Impressions(s) / ED Diagnoses   Final diagnoses:  Viral URI with cough    ED Discharge Orders  Ordered    benzonatate (TESSALON) 100 MG capsule  Every 8 hours     11/07/18 2205    guaiFENesin-codeine 100-10 MG/5ML syrup  Every 6 hours PRN     11/07/18 2205             Dietrich PatesKhatri, Leray Garverick, PA-C 11/07/18 2209    Alvira MondaySchlossman, Erin, MD 11/08/18 1149

## 2018-11-07 NOTE — Discharge Instructions (Addendum)
Return to the ED if you start to have worsening symptoms including chest pain, shortness of breath, increased blood in your sputum, swelling in your leg.

## 2018-11-07 NOTE — ED Triage Notes (Signed)
Per pt, has been coughing for 2 days and is now coughing up bloody sputum.

## 2018-12-06 ENCOUNTER — Other Ambulatory Visit: Payer: Self-pay

## 2018-12-06 ENCOUNTER — Emergency Department (HOSPITAL_COMMUNITY)
Admission: EM | Admit: 2018-12-06 | Discharge: 2018-12-06 | Disposition: A | Discharge status: Home / Self Care | Payer: Self-pay | Attending: Emergency Medicine | Admitting: Emergency Medicine

## 2018-12-06 ENCOUNTER — Encounter (HOSPITAL_COMMUNITY): Payer: Self-pay | Admitting: Emergency Medicine

## 2018-12-06 DIAGNOSIS — F17210 Nicotine dependence, cigarettes, uncomplicated: Secondary | ICD-10-CM | POA: Insufficient documentation

## 2018-12-06 DIAGNOSIS — R059 Cough, unspecified: Secondary | ICD-10-CM

## 2018-12-06 DIAGNOSIS — Z79899 Other long term (current) drug therapy: Secondary | ICD-10-CM | POA: Insufficient documentation

## 2018-12-06 DIAGNOSIS — R05 Cough: Secondary | ICD-10-CM | POA: Insufficient documentation

## 2018-12-06 DIAGNOSIS — J069 Acute upper respiratory infection, unspecified: Secondary | ICD-10-CM | POA: Insufficient documentation

## 2018-12-06 DIAGNOSIS — I10 Essential (primary) hypertension: Secondary | ICD-10-CM | POA: Insufficient documentation

## 2018-12-06 DIAGNOSIS — J029 Acute pharyngitis, unspecified: Secondary | ICD-10-CM

## 2018-12-06 LAB — GROUP A STREP BY PCR: Group A Strep by PCR: NOT DETECTED

## 2018-12-06 MED ORDER — BENZONATATE 100 MG PO CAPS: 100.0000 mg | ORAL_CAPSULE | Freq: Three times a day (TID) | ORAL | 0 refills | Status: DC

## 2018-12-06 MED ORDER — BENZONATATE 100 MG PO CAPS
200.0000 mg | ORAL_CAPSULE | Freq: Once | ORAL | Status: AC
  Administered 2018-12-06: 200 mg via ORAL
  Filled 2018-12-06: qty 2

## 2018-12-06 NOTE — Discharge Instructions (Signed)
Take the prescribed medication as directed.  Can continue using tea with honey, etc to help with cough. Would recommend decongestant like mucinex or sudafed to help break up congestion. Follow-up with your primary care doctor. Return to the ED for new or worsening symptoms.

## 2018-12-06 NOTE — ED Notes (Signed)
Declined W/C at D/C and was escorted to lobby by RN. 

## 2018-12-06 NOTE — ED Triage Notes (Signed)
Pt states he has been coughing up green sputum and has sore throat for 1 week

## 2018-12-06 NOTE — ED Provider Notes (Signed)
[facility] EMERGENCY DEPARTMENT Provider Note   CSN: [id number] Arrival date & time: 12/06/18  2103     History   Chief Complaint Chief Complaint  Patient presents with  . Sore Throat    HPI Walter Salinas is a 28 y.o. male.  The history is provided by the patient and medical records.  Sore Throat     28 y.o. M with hx of HTN, presenting to sore throat and cough for about a week now.  States similar last month, got somewhat better but started getting sick again.  Reports cough is dry.  Has had rhinorrhea, mucous is green sometimes but mostly clear.  Also reports sore throat, feels dry.  No fever/chills.  No chest pain or SOB.  Has been taking motrin and some OTC cough medications without much relief.  Past Medical History:  Diagnosis Date  . Hypertension     Patient Active Problem List   Diagnosis Date Noted  . Scabies 01/18/2011  . Exposure to STD 01/18/2011  . SORE THROAT 03/29/2010  . TOBACCO USER 08/15/2009  . SICKLE CELL TRAIT 02/10/2009  . HYPERTENSION, BENIGN SYSTEMIC 01/25/2007  . RHINITIS, ALLERGIC 01/25/2007    History reviewed. No pertinent surgical history.      Home Medications    Prior to Admission medications   Medication Sig Start Date End Date Taking? Authorizing Provider  benzonatate (TESSALON) 100 MG capsule Take 2 capsules (200 mg total) by mouth every 8 (eight) hours. 11/07/18   Khatri, Hina, PA-C  cetirizine (ZYRTEC) 10 MG tablet Take 10 mg by mouth daily as needed for allergies.    [provider]  clindamycin (CLEOCIN) 150 MG capsule Take 1 capsule (150 mg total) by mouth 3 (three) times daily. 09/25/16   Dowless, Lelon Mast Tripp, PA-C  cyclobenzaprine (FLEXERIL) 10 MG tablet Take 1 tablet (10 mg total) by mouth 2 (two) times daily as needed for muscle spasms. 10/10/16   Roxy Horseman, PA-C  famotidine (PEPCID) 20 MG tablet Take 1 tablet (20 mg total) by mouth 2 (two) times daily. 09/10/16   Donnetta Hutching, MD    guaiFENesin-codeine 100-10 MG/5ML syrup Take 5 mLs by mouth every 6 (six) hours as needed for cough. 11/07/18   Khatri, Hina, PA-C  hydrochlorothiazide (HYDRODIURIL) 25 MG tablet Take 1 tablet (25 mg total) by mouth daily. 09/10/16   Donnetta Hutching, MD  hydrocortisone (ANUSOL-HC) 2.5 % rectal cream Apply rectally 2 times daily for 7 days Patient not taking: Reported on 09/10/2016 07/06/16   Audry Pili, PA-C    Family History Family History  Problem Relation Age of Onset  . Hypertension Mother   . Hypertension Father     Social History Social History   Tobacco Use  . Smoking status: Current Every Day Smoker    Packs/day: 1.00    Types: Cigarettes  . Smokeless tobacco: Never Used  Substance Use Topics  . Alcohol use: Yes    Comment: socially  . Drug use: Yes    Frequency: 7.0 times per week    Types: Marijuana    Comment: daily use     Allergies   Patient has no known allergies.   Review of Systems Review of Systems  HENT: Positive for sore throat.   Respiratory: Positive for cough.   All other systems reviewed and are negative.    Physical Exam Updated Vital Signs BP (!) 160/108 (BP Location: Right Arm)   Pulse 100   Temp 98.8 F (37.1 C) (Oral)  Resp 12   Wt 77.1 kg   SpO2 99%   BMI 23.71 kg/m   Physical Exam Vitals signs and nursing note reviewed.  Constitutional:      Appearance: He is well-developed.  HENT:     Head: Normocephalic and atraumatic.     Right Ear: Tympanic membrane and ear canal normal.     Left Ear: Tympanic membrane and ear canal normal.     Nose: Nose normal.     Mouth/Throat:     Lips: Pink.     Mouth: Mucous membranes are moist.     Pharynx: Oropharynx is clear. Uvula midline. Posterior oropharyngeal erythema present.     Tonsils: No tonsillar exudate or tonsillar abscesses.  Eyes:     Conjunctiva/sclera: Conjunctivae normal.     Pupils: Pupils are equal, round, and reactive to light.  Neck:     Musculoskeletal: Normal  range of motion.  Cardiovascular:     Rate and Rhythm: Normal rate and regular rhythm.     Heart sounds: Normal heart sounds.  Pulmonary:     Effort: Pulmonary effort is normal.     Breath sounds: Normal breath sounds.  Abdominal:     General: Bowel sounds are normal.     Palpations: Abdomen is soft.  Musculoskeletal: Normal range of motion.  Skin:    General: Skin is warm and dry.  Neurological:     Mental Status: He is alert and oriented to person, place, and time.      ED Treatments / Results  Labs (all labs ordered are listed, but only abnormal results are displayed) Labs Reviewed  GROUP A STREP BY PCR    EKG None  Radiology No results found.  Procedures Procedures (including critical care time)  Medications Ordered in ED Medications - No data to display   Initial Impression / Assessment and Plan / ED Course  I have reviewed the triage vital signs and the nursing notes.  Pertinent labs & imaging results that were available during my care of the patient were reviewed by me and considered in my medical decision making (see chart for details).  28 y.o. M here with cough and sore throat for 1 week.  Similar symptoms last month.  Reports sick contacts with several children recently.  He is afebrile, non-toxic, very well appearing, and in NAD.  Oropharynx is erythematous without tonsillar edema or exudates.  Uvula midline, no PTA.  Lungs clear bilaterally.  Rapid strep is negative.  Suspect viral etiology.  Discussed symptomatic care.  Can follow-up with PCP.  Return here for any new/acute changes.  Final Clinical Impressions(s) / ED Diagnoses   Final diagnoses:  Cough  Sore throat    ED Discharge Orders         Ordered    benzonatate (TESSALON) 100 MG capsule  Every 8 hours     12/06/18 2223           [name], PA-C 12/06/18 2240    Raeford Razor, MD 12/11/18 1757

## 2018-12-12 ENCOUNTER — Emergency Department (HOSPITAL_COMMUNITY)
Admission: EM | Admit: 2018-12-12 | Discharge: 2018-12-12 | Disposition: A | Discharge status: Home / Self Care | Payer: Self-pay | Attending: Emergency Medicine | Admitting: Emergency Medicine

## 2018-12-12 ENCOUNTER — Encounter (HOSPITAL_COMMUNITY): Payer: Self-pay

## 2018-12-12 DIAGNOSIS — J029 Acute pharyngitis, unspecified: Secondary | ICD-10-CM | POA: Insufficient documentation

## 2018-12-12 DIAGNOSIS — Z5321 Procedure and treatment not carried out due to patient leaving prior to being seen by health care provider: Secondary | ICD-10-CM | POA: Insufficient documentation

## 2018-12-12 NOTE — ED Triage Notes (Signed)
Pt seen here several days ago for sore throat, neg for strep and it continues to hurt.

## 2018-12-12 NOTE — ED Notes (Signed)
Pt has decided he doesn't want to be seen. He has to go to work.

## 2019-05-30 ENCOUNTER — Other Ambulatory Visit: Payer: Self-pay

## 2019-05-30 ENCOUNTER — Ambulatory Visit: Payer: Self-pay | Admitting: Family Medicine

## 2019-05-30 ENCOUNTER — Ambulatory Visit (INDEPENDENT_AMBULATORY_CARE_PROVIDER_SITE_OTHER): Payer: Self-pay | Admitting: Family Medicine

## 2019-05-30 ENCOUNTER — Encounter: Payer: Self-pay | Admitting: Family Medicine

## 2019-05-30 DIAGNOSIS — F17200 Nicotine dependence, unspecified, uncomplicated: Secondary | ICD-10-CM

## 2019-05-30 DIAGNOSIS — J302 Other seasonal allergic rhinitis: Secondary | ICD-10-CM

## 2019-05-30 DIAGNOSIS — Z202 Contact with and (suspected) exposure to infections with a predominantly sexual mode of transmission: Secondary | ICD-10-CM

## 2019-05-30 DIAGNOSIS — I10 Essential (primary) hypertension: Secondary | ICD-10-CM

## 2019-05-30 DIAGNOSIS — D573 Sickle-cell trait: Secondary | ICD-10-CM

## 2019-05-30 MED ORDER — AMLODIPINE BESYLATE 5 MG PO TABS: 5.0000 mg | ORAL_TABLET | Freq: Every day | ORAL | 3 refills | Status: DC

## 2019-05-30 NOTE — Assessment & Plan Note (Signed)
Advised to quit.  

## 2019-05-30 NOTE — Progress Notes (Signed)
New Patient Office Visit  Subjective:  Patient ID: Walter Salinas, male    DOB: 1991/06/19  Age: 28 y.o. MRN: [id number]  CC:  Chief Complaint  Patient presents with  . Establish Care    HPI Walter Salinas presents for patient new to me plus concerns.  Issues: 1. HBP.  Not currently on any meds.  Diagnosed at age 18.  Previously had been on HCTZ.  Denies HA, CP, SOB.  No known kidney disease. 2. Patient has been in prison.  Denies symptoms.  Because of the special population, he is at risk for STDs.  Willing to be tested. 3. Rectal discomfort.  Previously told hemorrhoids.  Specifically concerned about prostate cancer.  No urgency, frequency, dysuria or fever.  No rash.  He does have occasional constipation and strain at stools.  Heterosexual. 4. Nasal congestion worse in spring.  No fever or SOB.  No high risk exposure to COVID.    Past Medical History:  Diagnosis Date  . Hypertension     History reviewed. No pertinent surgical history.  Family History  Problem Relation Age of Onset  . Hypertension Mother   . Hypertension Father     Social History   Socioeconomic History  . Marital status: Single    Spouse name: Not on file  . Number of children: Not on file  . Years of education: Not on file  . Highest education level: Not on file  Occupational History  . Not on file  Social Needs  . Financial resource strain: Not on file  . Food insecurity    Worry: Not on file    Inability: Not on file  . Transportation needs    Medical: Not on file    Non-medical: Not on file  Tobacco Use  . Smoking status: Current Every Day Smoker    Packs/day: 1.00    Types: Cigarettes  . Smokeless tobacco: Never Used  Substance and Sexual Activity  . Alcohol use: Yes    Comment: socially  . Drug use: Yes    Frequency: 7.0 times per week    Types: Marijuana    Comment: daily use  . Sexual activity: Not on file  Lifestyle  . Physical activity    Days per week: Not on file    Minutes per session: Not on file  . Stress: Not on file  Relationships  . Social Herbalist on phone: Not on file    Gets together: Not on file    Attends religious service: Not on file    Active member of club or organization: Not on file    Attends meetings of clubs or organizations: Not on file    Relationship status: Not on file  . Intimate partner violence    Fear of current or ex partner: Not on file    Emotionally abused: Not on file    Physically abused: Not on file    Forced sexual activity: Not on file  Other Topics Concern  . Not on file  Social History Narrative  . Not on file    ROS Review of Systems  Objective:   Today's Vitals: BP (!) 140/98   Pulse 71   Wt 179 lb 12.8 oz (81.6 kg)   SpO2 98%   BMI 25.08 kg/m   Physical Exam Lungs clear Cardiac RRR without m or g Abd benign Anogenital.  Normal external genitalia, circumcized male.  No skin lesions.  External hemorrhoid skin tags.  Normal rectal without prostate tenderness or lesions. Assessment & Plan:   Problem List Items Addressed This Visit    Exposure to STD   Relevant Orders   HIV Antibody (routine testing w rflx)   RPR   HYPERTENSION, BENIGN SYSTEMIC   Relevant Medications   amLODipine (NORVASC) 5 MG tablet   Other Relevant Orders   Basic Metabolic Panel   SICKLE CELL TRAIT   Relevant Orders   CBC      Outpatient Encounter Medications as of 05/30/2019  Medication Sig  . amLODipine (NORVASC) 5 MG tablet Take 1 tablet (5 mg total) by mouth at bedtime.  . [DISCONTINUED] benzonatate (TESSALON) 100 MG capsule Take 1 capsule (100 mg total) by mouth every 8 (eight) hours.  . [DISCONTINUED] cetirizine (ZYRTEC) 10 MG tablet Take 10 mg by mouth daily as needed for allergies.  . [DISCONTINUED] clindamycin (CLEOCIN) 150 MG capsule Take 1 capsule (150 mg total) by mouth 3 (three) times daily.  . [DISCONTINUED] cyclobenzaprine (FLEXERIL) 10 MG tablet Take 1 tablet (10 mg total) by mouth 2  (two) times daily as needed for muscle spasms.  . [DISCONTINUED] famotidine (PEPCID) 20 MG tablet Take 1 tablet (20 mg total) by mouth 2 (two) times daily.  . [DISCONTINUED] guaiFENesin-codeine 100-10 MG/5ML syrup Take 5 mLs by mouth every 6 (six) hours as needed for cough.  . [DISCONTINUED] hydrochlorothiazide (HYDRODIURIL) 25 MG tablet Take 1 tablet (25 mg total) by mouth daily.  . [DISCONTINUED] hydrocortisone (ANUSOL-HC) 2.5 % rectal cream Apply rectally 2 times daily for 7 days (Patient not taking: Reported on 09/10/2016)   No facility-administered encounter medications on file as of 05/30/2019.     Follow-up: No follow-ups on file.   Moses MannersWilliam A Dondre Catalfamo, MD

## 2019-05-30 NOTE — Assessment & Plan Note (Signed)
His symptoms are mild and he will use OTC meds.

## 2019-05-30 NOTE — Assessment & Plan Note (Signed)
Test for HIV and RPR

## 2019-05-30 NOTE — Patient Instructions (Signed)
You have hemorrhoids, which don't go away.  Don't strain or get constipated. Your prostate is fine I will call with the blood test results. I sent in a high blood pressure medication for you.  Get your blood pressure taken and write it down.  See me in about six weeks. I don't think you need coronavirus testing now.  If you develop fever or shortness of breath, yes you will need.

## 2019-05-30 NOTE — Assessment & Plan Note (Signed)
Start amlodipine

## 2019-05-31 LAB — BASIC METABOLIC PANEL
BUN/Creatinine Ratio: 15 (ref 9–20)
BUN: 18 mg/dL (ref 6–20)
CO2: 20 mmol/L (ref 20–29)
Calcium: 9.5 mg/dL (ref 8.7–10.2)
Chloride: 105 mmol/L (ref 96–106)
Creatinine, Ser: 1.21 mg/dL (ref 0.76–1.27)
GFR calc Af Amer: 92 mL/min/{1.73_m2} (ref >59)
GFR calc non Af Amer: 80 mL/min/{1.73_m2} (ref >59)
Glucose: 87 mg/dL (ref 65–99)
Potassium: 4.4 mmol/L (ref 3.5–5.2)
Sodium: 140 mmol/L (ref 134–144)

## 2019-05-31 LAB — CBC
Hematocrit: 45.8 % (ref 37.5–51.0)
Hemoglobin: 15.5 g/dL (ref 13.0–17.7)
MCH: 29.2 pg (ref 26.6–33.0)
MCHC: 33.8 g/dL (ref 31.5–35.7)
MCV: 86 fL (ref 79–97)
Platelets: 165 10*3/uL (ref 150–450)
RBC: 5.31 x10E6/uL (ref 4.14–5.80)
RDW: 14.7 % (ref 11.6–15.4)
WBC: 5.2 10*3/uL (ref 3.4–10.8)

## 2019-05-31 LAB — RPR: RPR Ser Ql: NONREACTIVE

## 2019-05-31 LAB — HIV ANTIBODY (ROUTINE TESTING W REFLEX): HIV Screen 4th Generation wRfx: NONREACTIVE

## 2019-07-20 IMAGING — CR DG CHEST 2V
2 series · 2 of 2 positions shown · non-contrast
Comparison: 09/27/2017

CLINICAL DATA: Cough

EXAM:
CHEST - 2 VIEW

[chest pa]
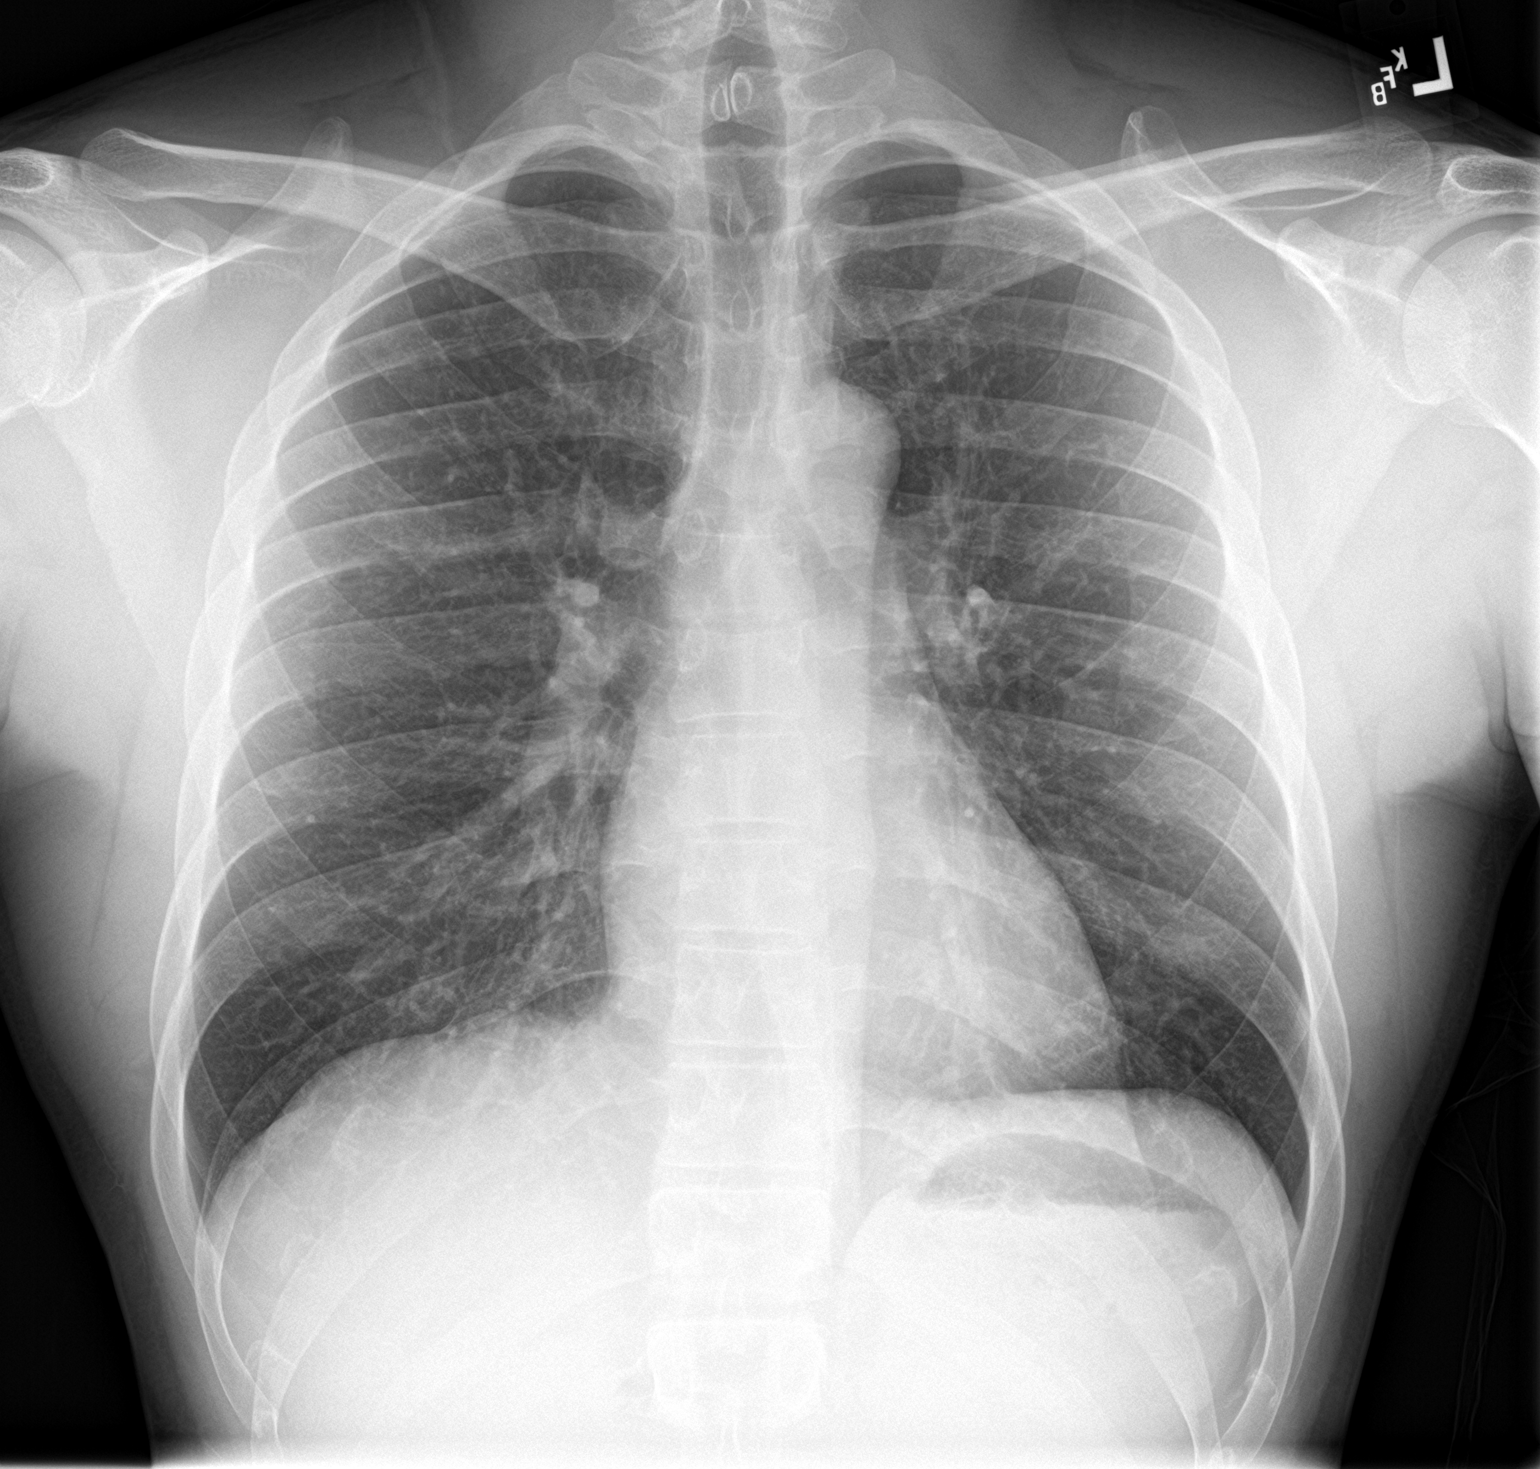

[chest lat]
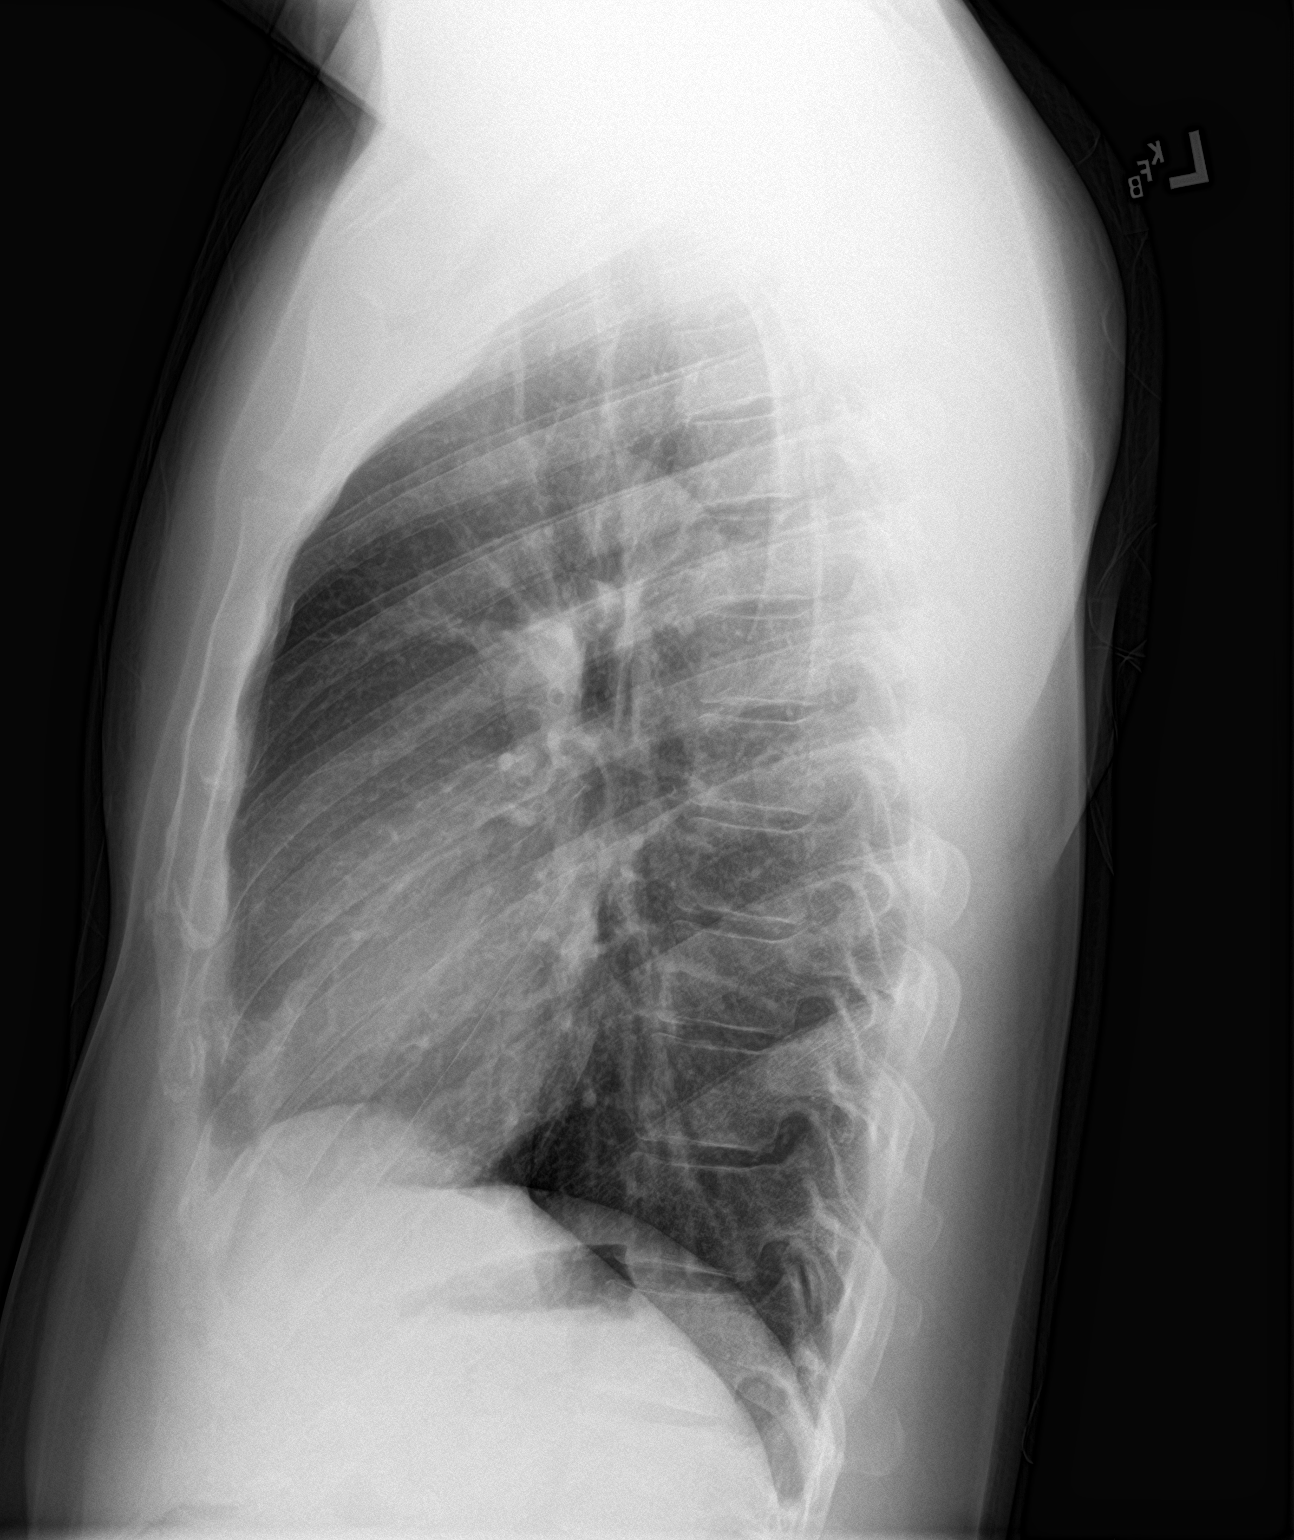

[2 of 2 positions shown; findings below may reference images not displayed]

FINDINGS: The heart size and mediastinal contours are within normal limits.
Both lungs are clear. The visualized skeletal structures are
unremarkable.
IMPRESSION: No active cardiopulmonary disease.

## 2019-11-26 ENCOUNTER — Other Ambulatory Visit: Payer: Self-pay | Admitting: Family Medicine

## 2019-11-26 DIAGNOSIS — I10 Essential (primary) hypertension: Secondary | ICD-10-CM

## 2019-11-26 MED ORDER — HYDROCHLOROTHIAZIDE 12.5 MG PO TABS: 12.5000 mg | ORAL_TABLET | Freq: Every day | ORAL | 3 refills | Status: DC

## 2019-11-26 NOTE — Telephone Encounter (Signed)
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  Stopped taking amlodipine due to "swimmy head"  Will not take amlodipine.  Will switch to HCTZ.  In for office visit as soon as he can.

## 2020-01-14 ENCOUNTER — Telehealth (INDEPENDENT_AMBULATORY_CARE_PROVIDER_SITE_OTHER): Payer: Self-pay | Admitting: Family Medicine

## 2020-01-14 ENCOUNTER — Encounter: Payer: Self-pay | Admitting: Family Medicine

## 2020-01-14 VITALS — Temp 100.2°F

## 2020-01-14 DIAGNOSIS — J039 Acute tonsillitis, unspecified: Secondary | ICD-10-CM

## 2020-01-14 MED ORDER — PENICILLIN V POTASSIUM 500 MG PO TABS: 500.0000 mg | ORAL_TABLET | Freq: Two times a day (BID) | ORAL | 0 refills | Status: DC

## 2020-01-14 NOTE — Progress Notes (Signed)
[facility] Telemedicine Visit  Patient consented to have virtual visit. Method of visit: Video  Encounter participants: Patient: Walter Salinas - located at home Provider: Lennox Solders - located at [facility] Others (if applicable): Patient's mother  Chief Complaint: Sore throat  HPI:  Sore throat Started 3 to 4 days ago Highest temperature was 100.2 No cough Endorses anterior cervical tender lymph nodes Says that his tonsils appear swollen and red, no exudate No known sick contacts No shortness of breath Has had sore throat repeatedly in the past, appears to have been treated as possible abscess with clindamycin and dexamethasone at one visit and with observation at another visit.  ROS: per HPI  Pertinent PMHx: Frequent tonsillitis, sickle cell trait, tobacco use disorder, STD exposure, hypertension, allergic rhinitis  Exam:  Respiratory: No shortness of breath or cough, no hot potato voice HEENT exam was attempted but was unable to be performed due to technical difficulties  Assessment/Plan:  Tonsillitis Appears to be recurrent.  Unable to properly examine throat or do rapid strep test or throat culture, so Centor criteria was used to stratify patient's risk level.  According to Centor criteria, he has about a 1 in 3 chance of having strep throat given his lack of cough, tender cervical lymphadenopathy, and swollen tonsils.  I told him that this is likely viral, but could not exclude strep throat.  I offered to treat symptomatically with watchful waiting or empirically treat with penicillin for possible strep throat.  Patient elected to treat empirically with antibiotic.  If this continues to pose a problem for him, would consider ENT referral.  Patient was given red flag symptoms including increasing fever and pain, shortness of breath, as well as increasing tonsillar swelling as reasons to go to emergency department.    Time spent during visit with  patient: 12 minutes

## 2020-01-15 NOTE — Assessment & Plan Note (Addendum)
Appears to be recurrent.  Unable to properly examine throat or do rapid strep test or throat culture, so Centor criteria was used to stratify patient's risk level.  According to Centor criteria, he has about a 1 in 3 chance of having strep throat given his lack of cough, tender cervical lymphadenopathy, and swollen tonsils.  I told him that this is likely viral, but could not exclude strep throat.  I offered to treat symptomatically with watchful waiting or empirically treat with penicillin for possible strep throat.  Patient elected to treat empirically with antibiotic.  If this continues to pose a problem for him, would consider ENT referral.  Patient was given red flag symptoms including increasing fever and pain, shortness of breath, as well as increasing tonsillar swelling as reasons to go to emergency department.

## 2020-05-27 NOTE — Progress Notes (Signed)
    SUBJECTIVE:   CHIEF COMPLAINT / HPI: Chest hurts  Chest Pain Pt reports chest pain starting 4 days ago.  Sharp and constant.Initially started on Right side and has since radiated to left.  Denies any shortness of breath or heart palpitations.  He endorses that smoking a blunt bought pain back.  Denies any fevers, nausea/vomiting or abdominal pain.  No hemoptysis or cough.  He reports that he lifted his spouse about one weeks ago, 230lbs.  He also endorses having injured his right should 3 weeks ago.  He has taken ibuprofen which has helped with the pain.  He endorses ETOH about 1 week ago and states that he" drink a lot" Quit smoking 73months ago, but endorses having a cigarette last week after arguing with his significant other.  He endorses having a lot of anger issues since his wife is now at work while he is at home with his children.  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    Blood Pressure Medication HCTZ 12.5mg , he had stopped taking but restarted 1 month ago. Denies any visual disturbances, headaches.  Currently having chest pain since Monday that is relieved with Ibuprofen.  PERTINENT  PMH / PSH:  HTCZ HTN Tobacco use  OBJECTIVE:   BP (!) 140/110   Pulse 78   Ht 5\' 11"  (1.803 m)   Wt 160 lb 9.6 oz (72.8 kg)   SpO2 99%   BMI 22.40 kg/m    General: Alert and oriented, no apparent distress   Cardiovascular: RRR with no murmurs noted, Left side chest tender to touch anteriorly,  no lower extremity edema Respiratory: CTA bilaterally, no IWOB or SOB  Psych: Behavior and speech appropriate to situation  ASSESSMENT/PLAN:   Atypical chest pain Pain reproducible, no tachycardia or SOB.  Chest pain likely MSK secondary to inflammatory process.   -ECG now, no STEMI -Naprosyn 500mg  BID x14 days -Tylenol 650 mg q8h -Follow up with PCP in 1-2 weeks -Strict return precautions provided -Resources provided for therapy to help with anger  management   HYPERTENSION, BENIGN SYSTEMIC BP 144/112, repeat 140/110.  Had not medications today.   -Continue HCTZ 12.5 mg daily -Follow up with PCP in 1-2 weeks -Discussed importance of taking medications regularly -Consider adding CCB if no improvement     , MD [facility]

## 2020-05-27 NOTE — Patient Instructions (Addendum)
Thank you for coming to see me today. It was a pleasure.    Please follow-up with your PCP in 1-2 weeks   If you have any questions or concerns, please do not hesitate to call the office at [phone].  Best,   Dana Allan, MD  Family Medicine Residency  Muscle Strain A muscle strain is an injury that happens when a muscle is stretched longer than normal. This can happen during a fall, sports, or lifting. This can tear some muscle fibers. Usually, recovery from muscle strain takes 1-2 weeks. Complete healing normally takes 5-6 weeks. This condition is first treated with PRICE therapy. This involves:  Protecting your muscle from being injured again.  Resting your injured muscle.  Icing your injured muscle.  Applying pressure (compression) to your injured muscle. This may be done with a splint or elastic bandage.  Raising (elevating) your injured muscle. Your doctor may also recommend medicine for pain. Follow these instructions at home: If you have a splint:  Wear the splint as told by your doctor. Take it off only as told by your doctor.  Loosen the splint if your fingers or toes tingle, get numb, or turn cold and blue.  Keep the splint clean.  If the splint is not waterproof: ? Do not let it get wet. ? Cover it with a watertight covering when you take a bath or a shower. Managing pain, stiffness, and swelling   If directed, put ice on your injured area. ? If you have a removable splint, take it off as told by your doctor. ? Put ice in a plastic bag. ? Place a towel between your skin and the bag. ? Leave the ice on for 20 minutes, 2-3 times a day.  Move your fingers or toes often. This helps to avoid stiffness and lessen swelling.  Raise your injured area above the level of your heart while you are sitting or lying down.  Wear an elastic bandage as told by your doctor. Make sure it is not too tight. General instructions  Take over-the-counter and prescription  medicines only as told by your doctor.  Limit your activity. Rest your injured muscle as told by your doctor. Your doctor may say that gentle movements are okay.  If physical therapy was prescribed, do exercises as told by your doctor.  Do not put pressure on any part of the splint until it is fully hardened. This may take many hours.  Do not use any products that contain nicotine or tobacco, such as cigarettes and e-cigarettes. These can delay bone healing. If you need help quitting, ask your doctor.  Warm up before you exercise. This helps to prevent more muscle strains.  Ask your doctor when it is safe to drive if you have a splint.  Keep all follow-up visits as told by your doctor. This is important. Contact a doctor if:  You have more pain or swelling in your injured area. Get help right away if:  You have any of these problems in your injured area: ? You have numbness. ? You have tingling. ? You lose a lot of strength. Summary  A muscle strain is an injury that happens when a muscle is stretched longer than normal.  This condition is first treated with PRICE therapy. This includes protecting, resting, icing, adding pressure, and raising your injury.  Limit your activity. Rest your injured muscle as told by your doctor. Your doctor may say that gentle movements are okay.  Warm up  before you exercise. This helps to prevent more muscle strains. This information is not intended to replace advice given to you by your health care provider. Make sure you discuss any questions you have with your health care provider. Document Revised: 01/10/2019 Document Reviewed: 12/21/2016 Elsevier Patient Education  2020 [employer].  Outpatient Mental Health Providers (No Insurance required or Self Pay)  [facility]) can see uninsured folks for outpatient therapy [email or web address]-[email or web address] [address] [place], [place] [address] [phone]  [facility]    Walk-in Mon-Fri,  8am-3pm [email or web address] Scull [address], [place], [place]  [phone]   [address]  [place] [phone] [facility] for psych med management, there may be a wait- if [facility] is working with clients for OPT, they will coordinate with [facility] for psych  [facility]   Walk-in-Clinic: Monday- Friday 9:00 AM - 4:00 PM [address]   [place], [place] [phone]  [facility] [place] ([employer]) walk in M-F 8am-12pm and  1pm-3pm [place]- [address]     [phone]  [employer] -[address]  Phone: [phone]  [employer] (Mental Health and substance challenges) [address]   [place] [phone]    [email or web address]    [facility]  [place] -[address] [place], [place]     Phone:  [phone] [place]-  [address]  [phone]   [facility]  [address] [place]  [phone] PrepaidHoliday.[email or web address]   [facility] ( virtual or zoom therapy) [email or web address]  [address]  [phone]    [facility] [phone]  grief counseling, dementia and caregiver support    [facility] Walk-in MWF 12:30 to 3:00     [address] [place] [address]  [phone]  www.ADSyes.org call to schedule an appointment    Mental Health [facility] Classes ,Support group, Peer support services, [address] [place], [place] [address] [phone]  PhotoSolver.pl           National Alliance on Mental Illness (NAMI) [place]- Wellness classes, Support groups        [address] [place], [place] [address] [phone]   [email or web address]   [facility]  (Psycho-social Rehabilitation clubhouse, Individual and group therapy) [address] [place], [place] [address]   336- [phone]  24- Hour Availability:  Tressie Ellis  Behavioral Health [phone] or [phone] * [facility] [employer] (Domestic Violence, Rape, etc. )[phone] Vesta Mixer [phone] or [phone] * [facility] [phone] only) [phone] (after hours) *[facility] Mobile Crisis Unit [phone] *[place] National Suicide Hotline (978)281-0249 Len Childs)

## 2020-05-28 ENCOUNTER — Other Ambulatory Visit: Payer: Self-pay

## 2020-05-28 ENCOUNTER — Ambulatory Visit (INDEPENDENT_AMBULATORY_CARE_PROVIDER_SITE_OTHER): Payer: Self-pay | Admitting: Family Medicine

## 2020-05-28 ENCOUNTER — Ambulatory Visit (HOSPITAL_COMMUNITY)
Admission: RE | Admit: 2020-05-28 | Discharge: 2020-05-28 | Disposition: A | Discharge status: Home / Self Care | Payer: Self-pay | Source: Ambulatory Visit | Attending: Family Medicine | Admitting: Family Medicine

## 2020-05-28 VITALS — BP 140/110 | HR 78 | Ht 71.0 in | Wt 160.6 lb

## 2020-05-28 DIAGNOSIS — R079 Chest pain, unspecified: Secondary | ICD-10-CM | POA: Insufficient documentation

## 2020-05-28 DIAGNOSIS — R0789 Other chest pain: Secondary | ICD-10-CM

## 2020-05-28 DIAGNOSIS — I10 Essential (primary) hypertension: Secondary | ICD-10-CM

## 2020-05-28 MED ORDER — NAPROXEN 500 MG PO TABS: 500.0000 mg | ORAL_TABLET | Freq: Two times a day (BID) | ORAL | 0 refills | Status: AC

## 2020-05-28 MED ORDER — ACETAMINOPHEN ER 650 MG PO TBCR: 650.0000 mg | EXTENDED_RELEASE_TABLET | Freq: Three times a day (TID) | ORAL | 0 refills | Status: AC | PRN

## 2020-06-01 ENCOUNTER — Encounter: Payer: Self-pay | Admitting: Family Medicine

## 2020-06-01 DIAGNOSIS — R0789 Other chest pain: Secondary | ICD-10-CM | POA: Insufficient documentation

## 2020-06-01 NOTE — Assessment & Plan Note (Signed)
BP 144/112, repeat 140/110.  Had not medications today.   -Continue HCTZ 12.5 mg daily -Follow up with PCP in 1-2 weeks -Discussed importance of taking medications regularly -Consider adding CCB if no improvement

## 2020-06-01 NOTE — Assessment & Plan Note (Signed)
Pain reproducible, no tachycardia or SOB.  Chest pain likely MSK secondary to inflammatory process.   -ECG now, no STEMI -Naprosyn 500mg  BID x14 days -Tylenol 650 mg q8h -Follow up with PCP in 1-2 weeks -Strict return precautions provided -Resources provided for therapy to help with anger management

## 2020-09-15 ENCOUNTER — Ambulatory Visit: Payer: Self-pay | Admitting: Family Medicine

## 2021-04-21 ENCOUNTER — Ambulatory Visit: Payer: Self-pay | Admitting: Family Medicine

## 2021-04-28 ENCOUNTER — Ambulatory Visit: Payer: Self-pay | Admitting: Family Medicine

## 2021-07-19 ENCOUNTER — Ambulatory Visit: Payer: Self-pay | Admitting: Family Medicine

## 2021-08-09 ENCOUNTER — Encounter: Payer: Self-pay | Admitting: Family Medicine

## 2021-08-09 ENCOUNTER — Other Ambulatory Visit: Payer: Self-pay

## 2021-08-09 ENCOUNTER — Ambulatory Visit (INDEPENDENT_AMBULATORY_CARE_PROVIDER_SITE_OTHER): Payer: Self-pay | Admitting: Family Medicine

## 2021-08-09 DIAGNOSIS — I10 Essential (primary) hypertension: Secondary | ICD-10-CM

## 2021-08-09 DIAGNOSIS — Z1159 Encounter for screening for other viral diseases: Secondary | ICD-10-CM | POA: Insufficient documentation

## 2021-08-09 DIAGNOSIS — Z202 Contact with and (suspected) exposure to infections with a predominantly sexual mode of transmission: Secondary | ICD-10-CM

## 2021-08-09 DIAGNOSIS — F419 Anxiety disorder, unspecified: Secondary | ICD-10-CM

## 2021-08-09 MED ORDER — METOPROLOL SUCCINATE ER 50 MG PO TB24: 50.0000 mg | ORAL_TABLET | Freq: Every day | ORAL | 3 refills | Status: DC

## 2021-08-09 MED ORDER — HYDROCHLOROTHIAZIDE 12.5 MG PO TABS: 12.5000 mg | ORAL_TABLET | Freq: Every day | ORAL | 3 refills | Status: DC

## 2021-08-09 MED ORDER — CITALOPRAM HYDROBROMIDE 20 MG PO TABS: 20.0000 mg | ORAL_TABLET | Freq: Every day | ORAL | 3 refills | Status: DC

## 2021-08-09 NOTE — Patient Instructions (Addendum)
I want to see you again in one month to make sure your blood pressure is coming down. Three medicines at the pharmacy. HCTZ which is the same morning medicine that been on.  Start that right away. Metoprolol is a blood pressure medicine and may also help with the heart pounding.  Start that right away. Citalopram is an anxiety medicine.  Wait a week or so before starting it.  The anxiety medicine takes a while.

## 2021-08-10 LAB — BASIC METABOLIC PANEL
BUN/Creatinine Ratio: 12 (ref 9–20)
BUN: 13 mg/dL (ref 6–20)
CO2: 24 mmol/L (ref 20–29)
Calcium: 9.4 mg/dL (ref 8.7–10.2)
Chloride: 103 mmol/L (ref 96–106)
Creatinine, Ser: 1.07 mg/dL (ref 0.76–1.27)
Glucose: 89 mg/dL (ref 65–99)
Potassium: 4.8 mmol/L (ref 3.5–5.2)
Sodium: 140 mmol/L (ref 134–144)
eGFR: 95 mL/min/{1.73_m2} (ref >59)

## 2021-08-10 LAB — HIV ANTIBODY (ROUTINE TESTING W REFLEX): HIV Screen 4th Generation wRfx: NONREACTIVE

## 2021-08-10 LAB — HEPATITIS C ANTIBODY: Hep C Virus Ab: <0.1 s/co ratio (ref 0.0–0.9)

## 2021-08-10 LAB — RPR: RPR Ser Ql: NONREACTIVE

## 2021-08-11 ENCOUNTER — Encounter: Payer: Self-pay | Admitting: Family Medicine

## 2021-08-11 NOTE — Assessment & Plan Note (Signed)
Neg RPR and HIV

## 2021-08-11 NOTE — Assessment & Plan Note (Signed)
Restart HCTZ and add metoprolol for the dual effect of treating HBP and lessening the heart pounding.  FU 1 month.

## 2021-08-11 NOTE — Progress Notes (Signed)
    SUBJECTIVE:   CHIEF COMPLAINT / HPI:   Hypertension, stress and panic attacks. HBP.  Prescribed HCTZ and it sounds like he has been out for a month or so.  No headache, CP or SOB except as below.  Wants refill on his HCTZ Spells of heart pounding and anxiety.  Seems to be worsening.  Denies substance abuse.  Does admit to moderate alcohol.  No weight loss.  No syncope or lightheadedness.   Does admit to depressive and anxious symptoms.  States that work is going poorly (repetitive, stressful and not rewarding) and home is difficult 5 children, who I believe are five years and under.  No si or hi.  No previous meds for anxiety or depression Knowing that I want to do blood testing, he desires to be screened for those STDs tested via blood.     OBJECTIVE:   BP (!) 148/100   Pulse 63   Ht 5\' 11"  (1.803 m)   Wt 161 lb 6.4 oz (73.2 kg)   SpO2 100%   BMI 22.51 kg/m   BP confirmed to be elevatied.   Neck, no thyromegally Lungs clear Cardiac RRR without m or g  ASSESSMENT/PLAN:   Encounter for hepatitis C screening test for low risk patient Result is neg.    Exposure to STD Neg RPR and HIV  HYPERTENSION, BENIGN SYSTEMIC Restart HCTZ and add metoprolol for the dual effect of treating HBP and lessening the heart pounding.  FU 1 month.  Anxiety Discussed and he would like meds.  Start citalopram.  Stagger the start to ensure no adverse side effects to combo of HCTZ and metoprolol.     , MD [facility]

## 2021-08-11 NOTE — Assessment & Plan Note (Signed)
Result is neg.

## 2021-08-11 NOTE — Assessment & Plan Note (Signed)
Discussed and he would like meds.  Start citalopram.  Stagger the start to ensure no adverse side effects to combo of HCTZ and metoprolol.

## 2021-09-02 ENCOUNTER — Other Ambulatory Visit: Payer: Self-pay | Admitting: Family Medicine

## 2021-09-02 DIAGNOSIS — F419 Anxiety disorder, unspecified: Secondary | ICD-10-CM

## 2021-11-30 ENCOUNTER — Encounter: Payer: Self-pay | Admitting: Family Medicine

## 2021-11-30 ENCOUNTER — Other Ambulatory Visit: Payer: Self-pay

## 2021-11-30 ENCOUNTER — Ambulatory Visit (INDEPENDENT_AMBULATORY_CARE_PROVIDER_SITE_OTHER): Payer: Self-pay | Admitting: Family Medicine

## 2021-11-30 ENCOUNTER — Other Ambulatory Visit (HOSPITAL_COMMUNITY)
Admission: RE | Admit: 2021-11-30 | Discharge: 2021-11-30 | Disposition: A | Discharge status: Home / Self Care | Payer: Self-pay | Source: Ambulatory Visit | Attending: Family Medicine | Admitting: Family Medicine

## 2021-11-30 DIAGNOSIS — Z202 Contact with and (suspected) exposure to infections with a predominantly sexual mode of transmission: Secondary | ICD-10-CM

## 2021-11-30 DIAGNOSIS — I10 Essential (primary) hypertension: Secondary | ICD-10-CM

## 2021-11-30 MED ORDER — HYDROCHLOROTHIAZIDE 12.5 MG PO TABS: 12.5000 mg | ORAL_TABLET | Freq: Every day | ORAL | 3 refills | Status: DC

## 2021-11-30 NOTE — Patient Instructions (Addendum)
I will do blood and urine today and call you with the results. You will need another set of blood tests late March, early April.   I need to know that your blood pressure is OK when you take the medicine.  The number we are looking for is 140/90 or less.

## 2021-11-30 NOTE — Progress Notes (Signed)
° ° °  SUBJECTIVE:   CHIEF COMPLAINT / HPI:   Concern for STD exposure. Patient had unprotected intercourse 2 weeks ago.  Afterward, the male partner stated she had an STD (he cannot remember the exact one.)  Wants tested.  He is asymptomatic.  Specifically denies rash, discharge, sore, dysuria, testicular pain, inquinal pain or fever.    Also HBP.  Taking only HCTZ erratically.  BP up.  States he has made important life style changes.    OBJECTIVE:   BP (!) 140/98    Pulse 79    Ht 5\' 11"  (1.803 m)    Wt 165 lb 9.6 oz (75.1 kg)    SpO2 100%    BMI 23.10 kg/m   Elevated BP noted. Lungs clear Cardiac RRR without m or g Lungs clear Abd benign Anogenital.  No penile rash or ulcer.  No testicular swelling or tenderness.  No urethral discharge. Skin no rash.   ASSESSMENT/PLAN:   Exposure to sexually transmitted disease (STD) Check GC, chlamydia, HIV and RPR.  Given exposure was only 2 weeks ago, will need to repeat the RPR and HIV 3 months post exposure.  HYPERTENSION, BENIGN SYSTEMIC Likely sub optimal control.  Take HCTZ regularly.  Home BP monitoring.       , MD [facility]

## 2021-11-30 NOTE — Assessment & Plan Note (Signed)
Check GC, chlamydia, HIV and RPR.  Given exposure was only 2 weeks ago, will need to repeat the RPR and HIV 3 months post exposure.

## 2021-11-30 NOTE — Assessment & Plan Note (Signed)
Likely sub optimal control.  Take HCTZ regularly.  Home BP monitoring.

## 2021-12-01 LAB — URINE CYTOLOGY ANCILLARY ONLY
Chlamydia: NEGATIVE
Comment: NEGATIVE
Comment: NORMAL
Neisseria Gonorrhea: NEGATIVE

## 2021-12-01 LAB — HIV ANTIBODY (ROUTINE TESTING W REFLEX): HIV Screen 4th Generation wRfx: NONREACTIVE

## 2021-12-01 LAB — RPR: RPR Ser Ql: NONREACTIVE

## 2021-12-02 ENCOUNTER — Telehealth: Payer: Self-pay | Admitting: Family Medicine

## 2021-12-02 NOTE — Telephone Encounter (Signed)
Great.  I had trouble reaching him by phone.  He had given me permission to leave a voice mail, but a male answered both times I tried to call and thus did not leave results.

## 2021-12-02 NOTE — Telephone Encounter (Signed)
Patient is calling and would like to speak with Dr. Andria Frames as soon as possible. He said Dr. Andria Frames has called twice concerning results from last visit. He states that it is urgent.   Please call patient back at [phone].

## 2021-12-02 NOTE — Telephone Encounter (Signed)
Patient called back to ask about results from his 11-30-21 visit.  Confirmed DOB and name and informed patient that his results were negative.  Patient voiced understanding and had no further questions.  Zylie Mumaw,CMA

## 2021-12-15 ENCOUNTER — Ambulatory Visit: Payer: Self-pay | Admitting: Family Medicine

## 2022-01-05 ENCOUNTER — Other Ambulatory Visit: Payer: Self-pay

## 2022-01-05 ENCOUNTER — Ambulatory Visit (INDEPENDENT_AMBULATORY_CARE_PROVIDER_SITE_OTHER): Payer: Self-pay | Admitting: Family Medicine

## 2022-01-05 ENCOUNTER — Encounter: Payer: Self-pay | Admitting: Family Medicine

## 2022-01-05 DIAGNOSIS — K0889 Other specified disorders of teeth and supporting structures: Secondary | ICD-10-CM | POA: Insufficient documentation

## 2022-01-05 MED ORDER — PENICILLIN V POTASSIUM 500 MG PO TABS: 500.0000 mg | ORAL_TABLET | Freq: Three times a day (TID) | ORAL | 0 refills | Status: DC

## 2022-01-05 NOTE — Patient Instructions (Signed)
Please see a dentist.  I think this is a tooth problem/infection.  I will put you on an antibiotic, but that only treats the symptom, not the underlying problem.

## 2022-01-06 ENCOUNTER — Encounter: Payer: Self-pay | Admitting: Family Medicine

## 2022-01-06 NOTE — Assessment & Plan Note (Signed)
I am concerned about early dental abscess.  Start on Pen V and recommend prompt dental appointment.

## 2022-01-06 NOTE — Progress Notes (Signed)
° ° °  SUBJECTIVE:   CHIEF COMPLAINT / HPI:   2 day hx of throat irritation and swelling under jaw.  Patient has no fever.  Is worried about wisdom teeth that were never removed.  "It has probably been 20 years since I saw a dentist.  All discomfort is on the right side.    OBJECTIVE:   BP (!) 143/95    Pulse 94    Ht 5\' 11"  (1.803 m)    Wt 166 lb 12.8 oz (75.7 kg)    SpO2 100%    BMI 23.26 kg/m   Oral - no tonsillar redness.  Partially erupted 3rd molar (wisdom tooth) on the right.  No obvious carries but considerable plaque buildup. Neck Right high ant cervical triangle or submandibular node which is mildly tender.  Otherwise neck normal Lungs clear   ASSESSMENT/PLAN:   Pain, dental I am concerned about early dental abscess.  Start on Pen V and recommend prompt dental appointment.     , MD [facility]

## 2022-02-10 ENCOUNTER — Telehealth: Payer: Self-pay

## 2022-02-10 NOTE — Telephone Encounter (Signed)
Noted and agree. 

## 2022-02-10 NOTE — Telephone Encounter (Signed)
Patient's mother, Vira Browns, calls nurse line requesting to schedule follow up appointment for patient with Dr. Leveda Anna. There is a signed DPR in chart review giving permission to talk with mother.  ? ?Mother reports that patient has been complaining of intermittent chest pain and dizziness for the last three to four months.  ? ?Pain does not radiate, no associated SHOB, mother states that he "feels like he pulled a muscle."  ? ?Mother is requesting appointment for April. Scheduled on 02/28/22. Given that patient's symptoms have been going on for the last 3-4 months, scheduled follow up appointment and provided with strict ED precautions.  ? ?Forwarding to PCP as FYI.  ? ?Veronda Prude, RN ? ?

## 2022-02-28 ENCOUNTER — Ambulatory Visit: Payer: Self-pay | Admitting: Family Medicine

## 2022-06-27 ENCOUNTER — Encounter: Payer: Self-pay | Admitting: Family Medicine

## 2022-08-14 ENCOUNTER — Other Ambulatory Visit: Payer: Self-pay

## 2022-08-14 ENCOUNTER — Encounter (HOSPITAL_COMMUNITY): Payer: Self-pay | Admitting: *Deleted

## 2022-08-14 ENCOUNTER — Ambulatory Visit (HOSPITAL_COMMUNITY)
Admission: EM | Admit: 2022-08-14 | Discharge: 2022-08-14 | Disposition: A | Discharge status: Home / Self Care | Payer: Self-pay | Attending: Internal Medicine | Admitting: Internal Medicine

## 2022-08-14 DIAGNOSIS — N4889 Other specified disorders of penis: Secondary | ICD-10-CM | POA: Insufficient documentation

## 2022-08-14 LAB — POCT URINALYSIS DIPSTICK, ED / UC
Bilirubin Urine: NEGATIVE
Glucose, UA: NEGATIVE mg/dL
Ketones, ur: NEGATIVE mg/dL
Nitrite: NEGATIVE
Protein, ur: NEGATIVE mg/dL
Specific Gravity, Urine: 1.02 (ref 1.005–1.030)
Urobilinogen, UA: 1 mg/dL (ref 0.0–1.0)
pH: 6 (ref 5.0–8.0)

## 2022-08-14 NOTE — ED Provider Notes (Signed)
[facility]    CSN: [id number] Arrival date & time: 08/14/22  1004      History   Chief Complaint Chief Complaint  Patient presents with   penis itching   dark urine    HPI Walter Salinas is a 31 y.o. male.   Patient presents with intermittent itching to the penile head and dark in urine for 7 days.  Has been using maximum condoms and endorses when he removes the condoms there has been pruritus.  Has been using cranberry juice which helps to minimize symptoms.  Sexually active, 2 male partners within the last 2 months, no known exposure, sometimes condom use.  Denies penile discharge, dysuria, hematuria, frequency, urgency, flank pain.    Past Medical History:  Diagnosis Date   Hypertension     Patient Active Problem List   Diagnosis Date Noted   Pain, dental 01/05/2022   Anxiety 08/09/2021   Exposure to sexually transmitted disease (STD) 01/18/2011   TOBACCO USER 08/15/2009   SICKLE CELL TRAIT 02/10/2009   HYPERTENSION, BENIGN SYSTEMIC 01/25/2007   Allergic rhinitis 01/25/2007    History reviewed. No pertinent surgical history.     Home Medications    Prior to Admission medications   Medication Sig Start Date End Date Taking? Authorizing Provider  hydrochlorothiazide (HYDRODIURIL) 12.5 MG tablet Take 1 tablet (12.5 mg total) by mouth daily. 11/30/21   Moses Manners, MD  penicillin v potassium (VEETID) 500 MG tablet Take 1 tablet (500 mg total) by mouth 3 (three) times daily. 01/05/22   Moses Manners, MD    Family History Family History  Problem Relation Age of Onset   Hypertension Mother    Hypertension Father     Social History Social History   Tobacco Use   Smoking status: Every Day    Packs/day: 1.00    Types: Cigarettes   Smokeless tobacco: Never  Substance Use Topics   Alcohol use: Yes    Comment: socially   Drug use: Yes    Frequency: 7.0 times per week    Types: Marijuana    Comment: daily use     Allergies    Patient has no known allergies.   Review of Systems Review of Systems  Constitutional: Negative.   Respiratory: Negative.    Cardiovascular: Negative.   Genitourinary:  Positive for penile pain. Negative for decreased urine volume, difficulty urinating, dysuria, enuresis, flank pain, frequency, genital sores, hematuria, penile discharge, penile swelling, scrotal swelling, testicular pain and urgency.  Skin: Negative.   Neurological: Negative.      Physical Exam Triage Vital Signs ED Triage Vitals  Enc Vitals Group     BP 08/14/22 1031 (!) 153/120     Pulse Rate 08/14/22 1031 67     Resp 08/14/22 1031 18     Temp 08/14/22 1031 98 F (36.7 C)     Temp src --      SpO2 08/14/22 1031 99 %     Weight --      Height --      Head Circumference --      Peak Flow --      Pain Score 08/14/22 1033 0     Pain Loc --      Pain Edu? --      Excl. in GC? --    No data found.  Updated Vital Signs BP (!) 153/120   Pulse 67   Temp 98 F (36.7 C)   Resp 18  SpO2 99%   Visual Acuity Right Eye Distance:   Left Eye Distance:   Bilateral Distance:    Right Eye Near:   Left Eye Near:    Bilateral Near:     Physical Exam Constitutional:      Appearance: Normal appearance.  Eyes:     Extraocular Movements: Extraocular movements intact.  Pulmonary:     Effort: Pulmonary effort is normal.  Genitourinary:    Comments: No rash or lesions noted to the penile head, no discharge present, anatomically without abnormality Skin:    General: Skin is warm and dry.  Neurological:     Mental Status: He is alert and oriented to person, place, and time. Mental status is at baseline.  Psychiatric:        Mood and Affect: Mood normal.        Behavior: Behavior normal.      UC Treatments / Results  Labs (all labs ordered are listed, but only abnormal results are displayed) Labs Reviewed  POCT URINALYSIS DIPSTICK, ED / UC - Abnormal; Notable for the following components:       Result Value   Hgb urine dipstick TRACE (*)    Leukocytes,Ua SMALL (*)    All other components within normal limits    EKG   Radiology No results found.  Procedures Procedures (including critical care time)  Medications Ordered in UC Medications - No data to display  Initial Impression / Assessment and Plan / UC Course  I have reviewed the triage vital signs and the nursing notes.  Pertinent labs & imaging results that were available during my care of the patient were reviewed by me and considered in my medical decision making (see chart for details).  Penile irritation   Urinalysis negative,, No abnormalities noted on exam, symptoms may be a result of irritation from friction or reaction to lubrication from condoms, discussed with patient, advised against use of mechanisms, recommended continued use of cranberry juice as it has been helpful, may also use hydrocortisone to external penis when symptoms are present, STI labs are pending, advised abstinence until lab results and/or treatment is complete, may follow-up with urgent care if symptoms continue to persist or worsen Final Clinical Impressions(s) / UC Diagnoses   Final diagnoses:  None   Discharge Instructions   None    ED Prescriptions   None    PDMP not reviewed this encounter.   Hans Eden, NP 08/14/22 1131

## 2022-08-14 NOTE — Discharge Instructions (Addendum)
Based on your symptoms this is most likely being caused by skin irritation  Your urinalysis today was negative for infection  Your STI labs are pending 2 to 3 days, you will be notified of positive test results only, medication can be sent to what ever sooner you are in within the state, you will have to return to this clinic or go to a local urgent care to be treated for gonorrhea if positive please refrain from having sex until your lab results return  In the meantime you may continue drinking cranberry juice as it has been helpful  I would advise changing condoms and avoid use of Madames if itching occurs after use as this is not normal and you may be having a reaction to the lubrication or the condom is causing friction to your penis  You may use hydrocortisone cream to the external penis do not place inside the home which may help to soothe symptoms  You may follow-up with urgent care as needed

## 2022-08-14 NOTE — ED Triage Notes (Signed)
PT reports he has itching to tip of penis and dark urine. Pt also wants a STD test for one week.

## 2022-08-15 LAB — CYTOLOGY, (ORAL, ANAL, URETHRAL) ANCILLARY ONLY
Chlamydia: POSITIVE — AB
Comment: NEGATIVE
Comment: NEGATIVE
Comment: NORMAL
Neisseria Gonorrhea: NEGATIVE
Trichomonas: NEGATIVE

## 2022-08-16 ENCOUNTER — Encounter (HOSPITAL_COMMUNITY): Payer: Self-pay

## 2022-08-16 ENCOUNTER — Ambulatory Visit (HOSPITAL_COMMUNITY)
Admission: RE | Admit: 2022-08-16 | Discharge: 2022-08-16 | Disposition: A | Discharge status: Home / Self Care | Payer: Self-pay | Source: Ambulatory Visit | Attending: Family Medicine | Admitting: Family Medicine

## 2022-08-16 DIAGNOSIS — A549 Gonococcal infection, unspecified: Secondary | ICD-10-CM

## 2022-08-16 MED ORDER — CEFTRIAXONE SODIUM 500 MG IJ SOLR
500.0000 mg | Freq: Once | INTRAMUSCULAR | Status: AC
  Administered 2022-08-16: 500 mg via INTRAMUSCULAR

## 2022-08-16 MED ORDER — CEFTRIAXONE SODIUM 500 MG IJ SOLR
INTRAMUSCULAR | Status: AC
  Filled 2022-08-16: qty 500

## 2022-08-16 MED ORDER — LIDOCAINE HCL (PF) 1 % IJ SOLN
INTRAMUSCULAR | Status: AC
  Filled 2022-08-16: qty 2

## 2022-08-16 NOTE — ED Triage Notes (Signed)
Pt presents to UC for STD treatment. IM rocephin ordered per protocol.

## 2022-08-18 ENCOUNTER — Telehealth (HOSPITAL_COMMUNITY): Payer: Self-pay | Admitting: Family Medicine

## 2022-08-18 MED ORDER — DOXYCYCLINE HYCLATE 100 MG PO CAPS: 100.0000 mg | ORAL_CAPSULE | Freq: Two times a day (BID) | ORAL | 0 refills | Status: AC

## 2022-08-18 NOTE — Telephone Encounter (Signed)
We cannot find that doxycycline rx was sent in to pharmacy as intended. Rx sent today to treat his swab that was positive for chlamydia.

## 2022-11-15 ENCOUNTER — Encounter (HOSPITAL_COMMUNITY): Payer: Self-pay

## 2022-11-15 ENCOUNTER — Emergency Department (HOSPITAL_COMMUNITY)
Admission: EM | Admit: 2022-11-15 | Discharge: 2022-11-15 | Disposition: A | Discharge status: Home / Self Care | Payer: 59 | Attending: Emergency Medicine | Admitting: Emergency Medicine

## 2022-11-15 DIAGNOSIS — R300 Dysuria: Secondary | ICD-10-CM | POA: Insufficient documentation

## 2022-11-15 DIAGNOSIS — R309 Painful micturition, unspecified: Secondary | ICD-10-CM | POA: Insufficient documentation

## 2022-11-15 LAB — URINALYSIS, ROUTINE W REFLEX MICROSCOPIC
Bacteria, UA: NONE SEEN
Bilirubin Urine: NEGATIVE
Glucose, UA: NEGATIVE mg/dL
Ketones, ur: NEGATIVE mg/dL
Nitrite: NEGATIVE
Protein, ur: 30 mg/dL — AB
Specific Gravity, Urine: 1.015 (ref 1.005–1.030)
pH: 5 (ref 5.0–8.0)

## 2022-11-15 LAB — HIV ANTIBODY (ROUTINE TESTING W REFLEX): HIV Screen 4th Generation wRfx: NONREACTIVE

## 2022-11-15 MED ORDER — LIDOCAINE HCL (PF) 1 % IJ SOLN
1.0000 mL | Freq: Once | INTRAMUSCULAR | Status: AC
  Administered 2022-11-15: 1 mL
  Filled 2022-11-15: qty 5

## 2022-11-15 MED ORDER — METRONIDAZOLE 500 MG PO TABS
2000.0000 mg | ORAL_TABLET | Freq: Once | ORAL | Status: AC
  Administered 2022-11-15: 2000 mg via ORAL
  Filled 2022-11-15: qty 4

## 2022-11-15 MED ORDER — CEFTRIAXONE SODIUM 500 MG IJ SOLR
500.0000 mg | Freq: Once | INTRAMUSCULAR | Status: AC
  Administered 2022-11-15: 500 mg via INTRAMUSCULAR
  Filled 2022-11-15: qty 500

## 2022-11-15 MED ORDER — DOXYCYCLINE HYCLATE 100 MG PO TABS
100.0000 mg | ORAL_TABLET | Freq: Once | ORAL | Status: AC
  Administered 2022-11-15: 100 mg via ORAL
  Filled 2022-11-15: qty 1

## 2022-11-15 MED ORDER — DOXYCYCLINE HYCLATE 100 MG PO CAPS: 100.0000 mg | ORAL_CAPSULE | Freq: Two times a day (BID) | ORAL | 0 refills | Status: DC

## 2022-11-15 NOTE — ED Provider Triage Note (Signed)
Emergency Medicine Provider Triage Evaluation Note  Walter Salinas , a 31 y.o. male  was evaluated in triage.  Pt complains of dysuria.  Started about 2 weeks ago.  Mention that he had sex with his girlfriend during her period and concerned that he has STI.  States he is only sexually active with his girlfriend.  Was recently evaluated for this complaint couple weeks ago.  States he got a "shot" and was started on a pill of some kind.  States that he took the pill only once a day and it was prescribed for twice a day.  Review of Systems  Positive: See above Negative: See above  Physical Exam  BP (!) 170/110   Pulse 87   Temp 98.5 F (36.9 C)   Resp 16   Ht 5\' 11"  (1.803 m)   Wt 70.3 kg   SpO2 100%   BMI 21.62 kg/m  Gen:   Awake, no distress   Resp:  Normal effort  MSK:   Moves extremities without difficulty  Other:    Medical Decision Making  Medically screening exam initiated at 12:00 PM.  Appropriate orders placed.  was informed that the remainder of the evaluation will be completed by another provider, this initial triage assessment does not replace that evaluation, and the importance of remaining in the ED until their evaluation is complete.  Work up started   [name], PA-C 11/15/22 1203

## 2022-11-15 NOTE — ED Provider Notes (Signed)
[facility] EMERGENCY DEPARTMENT Provider Note   CSN: [id number] Arrival date & time: 11/15/22  1048     History  Chief Complaint  Patient presents with   Exposure to STD   Dysuria   HPI Walter Salinas is a 31 y.o. male  presenting for dysuria.  Started about 2 weeks ago.  Mention that he had sex with his girlfriend during her period and concerned that he now has STI.  States he is only sexually active with his girlfriend.  Endorses continued pain with urination.  No abnormal penile discharge.  Was recently evaluated for this complaint couple weeks ago.  States he got a "shot" and was started on a pill of some kind.  States that he took the pill only once a day and it was prescribed for twice a day. Also requesting screening for HIV.  States last drug of alcohol was sometime early last week.    Exposure to STD  Dysuria Presenting symptoms: dysuria        Home Medications Prior to Admission medications   Medication Sig Start Date End Date Taking? Authorizing Provider  hydrochlorothiazide (HYDRODIURIL) 12.5 MG tablet Take 1 tablet (12.5 mg total) by mouth daily. 11/30/21   Walter Manners, MD      Allergies    Patient has no known allergies.    Review of Systems   Review of Systems  Genitourinary:  Positive for dysuria.    Physical Exam Updated Vital Signs BP (!) 170/110   Pulse 87   Temp 98.5 F (36.9 C)   Resp 16   Ht 5\' 11"  (1.803 m)   Wt 70.3 kg   SpO2 100%   BMI 21.62 kg/m  Physical Exam Constitutional:      Appearance: Normal appearance.  HENT:     Head: Normocephalic.     Nose: Nose normal.  Eyes:     Conjunctiva/sclera: Conjunctivae normal.  Cardiovascular:     Pulses: Normal pulses.     Heart sounds: Normal heart sounds.  Pulmonary:     Effort: Pulmonary effort is normal.  Neurological:     Mental Status: He is alert.  Psychiatric:        Mood and Affect: Mood normal.     ED Results / Procedures / Treatments    Labs (all labs ordered are listed, but only abnormal results are displayed) Labs Reviewed  URINALYSIS, ROUTINE W REFLEX MICROSCOPIC  HIV ANTIBODY (ROUTINE TESTING W REFLEX)  GC/CHLAMYDIA PROBE AMP ([place]) NOT AT [facility]    EKG None  Radiology No results found.  Procedures Procedures    Medications Ordered in ED Medications - No data to display  ED Course/ Medical Decision Making/ A&P                           Medical Decision Making  31 yo well appearing male who is presenting for dysuria.  Symptoms are consistent with urethritis.  Treated empirically for gonorrhea chlamydia and trichomonas.  Also screened for HIV.  Advised patient to follow-up on his results on his MyChart.  Recommend that he advise his sexual partner/s to see treatment from the health department if he is positive for infection.  Sent the rest of his doxycycline course to his pharmacy.        Final Clinical Impression(s) / ED Diagnoses Final diagnoses:  Dysuria    Rx / DC Orders ED Discharge Orders  None         Harriet Pho, PA-C 11/15/22 1254    Carmin Muskrat, MD 11/15/22 [phone]

## 2022-11-15 NOTE — Discharge Instructions (Addendum)
Treating your painful urination with antibiotics to cover for possible gonorrhea, chlamydia and/or trichomonas infections.  Please follow-up your results of the STI exposure panel on MyChart.  Recommend that you inform your sexual partner/s of the results and advised him to seek out treatment at the health department in [facility].

## 2022-11-15 NOTE — ED Triage Notes (Addendum)
Pt arrives pov from home. Pt concerned for possible STD. He reports burning with urination and darkened urine over the past several days. Denies discharge. Pt reports he completed a course of treatment for chlamydia last month. PT is hypertensive during triage, no associated symptoms. States he doesn't take his bp medication.

## 2022-11-25 ENCOUNTER — Ambulatory Visit: Payer: Self-pay | Admitting: Family Medicine

## 2023-01-03 ENCOUNTER — Encounter: Payer: Self-pay | Admitting: Family Medicine

## 2023-04-20 ENCOUNTER — Ambulatory Visit (INDEPENDENT_AMBULATORY_CARE_PROVIDER_SITE_OTHER): Payer: Self-pay | Admitting: Family Medicine

## 2023-04-20 ENCOUNTER — Other Ambulatory Visit (HOSPITAL_COMMUNITY)
Admission: RE | Admit: 2023-04-20 | Discharge: 2023-04-20 | Disposition: A | Discharge status: Home / Self Care | Payer: Self-pay | Source: Ambulatory Visit | Attending: Family Medicine | Admitting: Family Medicine

## 2023-04-20 ENCOUNTER — Encounter: Payer: Self-pay | Admitting: Family Medicine

## 2023-04-20 VITALS — BP 132/92 | HR 88 | Ht 71.0 in | Wt 169.4 lb

## 2023-04-20 DIAGNOSIS — R300 Dysuria: Secondary | ICD-10-CM

## 2023-04-20 DIAGNOSIS — R109 Unspecified abdominal pain: Secondary | ICD-10-CM | POA: Insufficient documentation

## 2023-04-20 DIAGNOSIS — Z13220 Encounter for screening for lipoid disorders: Secondary | ICD-10-CM

## 2023-04-20 DIAGNOSIS — Z113 Encounter for screening for infections with a predominantly sexual mode of transmission: Secondary | ICD-10-CM

## 2023-04-20 DIAGNOSIS — I10 Essential (primary) hypertension: Secondary | ICD-10-CM

## 2023-04-20 LAB — POCT URINALYSIS DIP (MANUAL ENTRY)
Bilirubin, UA: NEGATIVE
Glucose, UA: NEGATIVE mg/dL
Ketones, POC UA: NEGATIVE mg/dL
Nitrite, UA: NEGATIVE
Protein Ur, POC: 30 mg/dL — AB
Spec Grav, UA: 1.02 (ref 1.010–1.025)
Urobilinogen, UA: 1 E.U./dL
pH, UA: 6.5 (ref 5.0–8.0)

## 2023-04-20 LAB — POCT UA - MICROSCOPIC ONLY: Epithelial cells, urine per micros: NONE SEEN

## 2023-04-20 NOTE — Progress Notes (Signed)
    SUBJECTIVE:   CHIEF COMPLAINT / HPI:   Walter Salinas is a 32 y.o. male who presents to the [facility] clinic today to discuss the following concerns:   Urinary Sx  For the last 2 months he has seen "white stuff" in his urine. Thinks it may be pus. He also reports intermittent itching sensation to the head of his penis. No discharge or pain with urinating.   Has been seen in the past for similar symptoms. Note from 07/2022 advised him to use hydrocortisone as needed but patient reports he never did that.   Reports one sexual partner. No condom use. No blood in his urine, no penile discharge. Fiance just got tested earlier this week and was negative for STI.   HTN Has not been taking his HCTZ- makes him dizzy. Reports he has had HTN since the age of 58. Runs in his family. He is active.   PERTINENT  PMH / PSH: Chlamydia, sickle cell trait, HTN   OBJECTIVE:   BP (!) 152/102   Pulse 88   Ht 5\' 11"  (1.803 m)   Wt 169 lb 6.4 oz (76.8 kg)   SpO2 97%   BMI 23.63 kg/m   Vitals:   04/20/23 0835 04/20/23 0848  BP: (!) 152/102 (!) 132/92  Pulse: 88   SpO2: 97%    General: NAD, pleasant, able to participate in exam Respiratory: normal effort Psych: Normal affect and mood  ASSESSMENT/PLAN:   1. Dysuria Unclear cause. Dipstick with trace Hgb but no only 0-2 on microscopy. 0-5 WBC. Does not sound like UTI but will follow up culture.  Pelvic exam not performed today as patient declined any external abnormalities to penis or glans.  - POCT urinalysis dipstick - Urine cytology ancillary only - POCT UA - Microscopic Only - Urine Culture  2. Hypertension, unspecified type Has strong family history of hypertension. Per his report, has had hypertension since the age of 56. He is thin and active. He is not currently taking his HCTZ as it makes him feel dizzy, even at the lowest dose. Does not use cocaine. Asymptomatic currently. BP improved on recheck but still above goal. - Basic Metabolic  Panel - Aldosterone + renin activity w/ ratio - 24 hour ambulatory BP monitoring in June with Dr. Raymondo Band - Recommend salt reduction   3. Lipid screening - Lipid Panel  4. Routine screening for STI (sexually transmitted infection) Has hx of chlamydia.  Reports that he is monogamous with 1 male partner and reports she recently tested negative. Given this, will not empirically treat and will await results.  - HIV antibody (with reflex) - RPR - Hepatitis B surface antigen    [name], DO [facility]

## 2023-04-20 NOTE — Patient Instructions (Signed)
It was wonderful to see you today.  Please bring ALL of your medications with you to every visit.   Today we talked about:  We are doing lab work today to check your cholesterol, kidneys, electrolytes, STI screening and for a urinary tract infection. I will send you a MyChart message if you have MyChart. Otherwise, I will give you a call for abnormal results or send a letter if everything returned back normal. If you don't hear from me in 2 weeks, please call the office.    Thank you for coming to your visit as scheduled. We have had a large "no-show" problem lately, and this significantly limits our ability to see and care for patients. As a friendly reminder- if you cannot make your appointment please call to cancel. We do have a no show policy for those who do not cancel within 24 hours. Our policy is that if you miss or fail to cancel an appointment within 24 hours, 3 times in a 17-month period, you may be dismissed from our clinic.   Thank you for choosing [facility].   Please call [phone] with any questions about today's appointment.  Please be sure to schedule follow up at the front  desk before you leave today.   [name], DO PGY-3 Family Medicine

## 2023-04-21 LAB — URINE CYTOLOGY ANCILLARY ONLY
Chlamydia: POSITIVE — AB
Comment: NEGATIVE
Comment: NEGATIVE
Comment: NORMAL
Neisseria Gonorrhea: NEGATIVE
Trichomonas: NEGATIVE

## 2023-04-22 ENCOUNTER — Other Ambulatory Visit: Payer: Self-pay | Admitting: Family Medicine

## 2023-04-22 DIAGNOSIS — A749 Chlamydial infection, unspecified: Secondary | ICD-10-CM

## 2023-04-22 LAB — URINE CULTURE

## 2023-04-22 MED ORDER — DOXYCYCLINE HYCLATE 100 MG PO CAPS: 100.0000 mg | ORAL_CAPSULE | Freq: Two times a day (BID) | ORAL | 0 refills | Status: AC

## 2023-04-26 LAB — ALDOSTERONE + RENIN ACTIVITY W/ RATIO
Aldos/Renin Ratio: 2.4 (ref 0.0–30.0)
Aldosterone: 2.7 ng/dL (ref 0.0–30.0)
Renin Activity, Plasma: 1.114 ng/mL/hr (ref 0.167–5.380)

## 2023-04-26 LAB — BASIC METABOLIC PANEL
BUN/Creatinine Ratio: 10 (ref 9–20)
BUN: 13 mg/dL (ref 6–20)
CO2: 21 mmol/L (ref 20–29)
Calcium: 9.2 mg/dL (ref 8.7–10.2)
Chloride: 104 mmol/L (ref 96–106)
Creatinine, Ser: 1.28 mg/dL — ABNORMAL HIGH (ref 0.76–1.27)
Glucose: 97 mg/dL (ref 70–99)
Potassium: 4.1 mmol/L (ref 3.5–5.2)
Sodium: 141 mmol/L (ref 134–144)
eGFR: 75 mL/min/{1.73_m2} (ref >59)

## 2023-04-26 LAB — LIPID PANEL
Chol/HDL Ratio: 2.1 ratio (ref 0.0–5.0)
Cholesterol, Total: 125 mg/dL (ref 100–199)
HDL: 59 mg/dL (ref >39)
LDL Chol Calc (NIH): 51 mg/dL (ref 0–99)
Triglycerides: 77 mg/dL (ref 0–149)
VLDL Cholesterol Cal: 15 mg/dL (ref 5–40)

## 2023-04-26 LAB — HEPATITIS B SURFACE ANTIGEN: Hepatitis B Surface Ag: NEGATIVE

## 2023-04-26 LAB — HIV ANTIBODY (ROUTINE TESTING W REFLEX): HIV Screen 4th Generation wRfx: NONREACTIVE

## 2023-04-26 LAB — RPR: RPR Ser Ql: NONREACTIVE

## 2023-05-03 ENCOUNTER — Ambulatory Visit: Payer: Self-pay | Admitting: Pharmacist

## 2023-08-30 ENCOUNTER — Ambulatory Visit (INDEPENDENT_AMBULATORY_CARE_PROVIDER_SITE_OTHER): Payer: Self-pay | Admitting: Family Medicine

## 2023-08-30 ENCOUNTER — Encounter: Payer: Self-pay | Admitting: Family Medicine

## 2023-08-30 VITALS — BP 128/78 | HR 62 | Ht 71.0 in | Wt 160.4 lb

## 2023-08-30 DIAGNOSIS — Z202 Contact with and (suspected) exposure to infections with a predominantly sexual mode of transmission: Secondary | ICD-10-CM

## 2023-08-30 DIAGNOSIS — Z0000 Encounter for general adult medical examination without abnormal findings: Secondary | ICD-10-CM

## 2023-08-30 DIAGNOSIS — Z114 Encounter for screening for human immunodeficiency virus [HIV]: Secondary | ICD-10-CM

## 2023-08-30 DIAGNOSIS — I10 Essential (primary) hypertension: Secondary | ICD-10-CM

## 2023-08-30 MED ORDER — IRBESARTAN 75 MG PO TABS: 75.0000 mg | ORAL_TABLET | Freq: Every day | ORAL | 3 refills | Status: DC

## 2023-08-30 MED ORDER — CANDESARTAN CILEXETIL 8 MG PO TABS: 8.0000 mg | ORAL_TABLET | Freq: Every day | ORAL | 3 refills | Status: DC

## 2023-08-30 NOTE — Patient Instructions (Signed)
I am starting you on candesartan for blood pressure. I doubt you will even notice you are taking ut. If you do have any issues, please let me know! I will send you a note about your kidney function in the mail.

## 2023-08-31 LAB — BASIC METABOLIC PANEL
BUN/Creatinine Ratio: 12 (ref 9–20)
BUN: 14 mg/dL (ref 6–20)
CO2: 22 mmol/L (ref 20–29)
Calcium: 9.3 mg/dL (ref 8.7–10.2)
Chloride: 103 mmol/L (ref 96–106)
Creatinine, Ser: 1.18 mg/dL (ref 0.76–1.27)
Glucose: 85 mg/dL (ref 70–99)
Potassium: 4.6 mmol/L (ref 3.5–5.2)
Sodium: 142 mmol/L (ref 134–144)
eGFR: 83 mL/min/{1.73_m2} (ref >59)

## 2023-08-31 LAB — LIPID PANEL
Chol/HDL Ratio: 2.2 {ratio} (ref 0.0–5.0)
Cholesterol, Total: 121 mg/dL (ref 100–199)
HDL: 56 mg/dL (ref >39)
LDL Chol Calc (NIH): 50 mg/dL (ref 0–99)
Triglycerides: 74 mg/dL (ref 0–149)
VLDL Cholesterol Cal: 15 mg/dL (ref 5–40)

## 2023-08-31 LAB — HIV ANTIBODY (ROUTINE TESTING W REFLEX): HIV Screen 4th Generation wRfx: NONREACTIVE

## 2023-08-31 NOTE — Assessment & Plan Note (Signed)
He has previously been on diuretic, metoprolol, lisinopril and amlodipine. Reviewed his kidney function (GFR = 75-83 in 2024). Will try ARB. Initially tried candesartan but it was too expensive. Irbesartan is on low price list so we called him and are switching him to that. He will call if problem with that rx. Labs today. F/u in 1 month.

## 2023-08-31 NOTE — Assessment & Plan Note (Signed)
Reviewed prior HIV screen which was iimmediately after exposure. Agree with repeat today.

## 2023-08-31 NOTE — Progress Notes (Signed)
    CHIEF COMPLAINT / HPI:  Well adult check up Has not been taking BP meds for a long time as they made him feel bad (dizzy at times). Wants to take better care of himself so he is coming in for care. Lots of family members have kidney issues and he does not want to end up on dialysis. Brings some BP readings from [place] recent [place].  No chest pain, no visual symptoms and no headaches. Cannot tell when his BP is up.  Also recently had treatment for an STD (May 2024). He took all of his meds but now wants to get checked for HIV. He was checked then but it was still early after exposure so he wants f/u check now. No current symptoms.    PERTINENT  PMH / PSH: I have reviewed the patient's medications, allergies, past medical and surgical history, smoking status and updated in the EMR as appropriate.   OBJECTIVE:  BP 128/78   Pulse 62   Ht 5\' 11"  (1.803 m)   Wt 160 lb 6.4 oz (72.8 kg)   SpO2 100%   BMI 22.37 kg/m  Vital signs reviewed. GENERAL: Well-developed, well-nourished, no acute distress. CARDIOVASCULAR: Regular rate and rhythm no murmur gallop or rub LUNGS: Clear to auscultation bilaterally, no rales or wheeze. ABDOMEN: Soft positive bowel sounds NEURO: No gross focal neurological deficits. MSK: Movement of extremity x 4. PSYCH: AxOx4. Good eye contact.. No psychomotor retardation or agitation. Appropriate speech fluency and content. Asks and answers questions appropriately. Mood is congruent.     ASSESSMENT / PLAN:   No problem-specific Assessment & Plan notes found for this encounter.   Denny Levy MD

## 2023-09-02 ENCOUNTER — Encounter: Payer: Self-pay | Admitting: Family Medicine

## 2023-11-30 NOTE — Progress Notes (Addendum)
    SUBJECTIVE:   CHIEF COMPLAINT / HPI: STD screening  Significant other was involved with someone else previously who had Chlamydia but then he started having intercourse with her. She had no symptoms and wasn't aware of Chlamydia at the time. Alan reports having intercourse with her towards the end of her recent treatment course, but he has no symptoms at this time. No urethral discharge, lesions, or dysuria currently.   He also reports that on 12/20 he received oral sex from different male who had a red bump on her limp which he is concerned about. Denied oral or anal swabs at this time.   PERTINENT  PMH / PSH: HTN  OBJECTIVE:   BP (!) 162/102   Pulse 85   Ht 5' 11 (1.803 m)   Wt 162 lb 6 oz (73.7 kg)   SpO2 100%   BMI 22.65 kg/m   General: Awake and Alert in NAD HEENT: NCAT. Sclera anicteric. No rhinorrhea. Cardiovascular: RRR. No M/R/G Respiratory: CTAB, normal WOB on RA. No wheezing, crackles, rhonchi, or diminished breath sounds. Abdomen: Soft, non-tender, non-distended. Bowel sounds normoactive Extremities: Able to move all extremities equally. No BLE edema, no deformities or significant joint findings. Skin: Warm and dry. No abrasions or rashes noted. Neuro: A&Ox3. No focal neurological deficits. M GU: normal penis and testes/no scrotal mass/no tenderness/urethral discharge/penile lesion Chaperoned by CMA Tashira. ASSESSMENT/PLAN:   Assessment & Plan Screening examination for STD (sexually transmitted disease) Patient would like to get STD testing today due to possible exposure. - STD testing - urine cytology, HIV and RPR - Will follow-up with results through MyChart - Advised practicing safe sex  Kathrine Melena, DO [facility]

## 2023-12-01 ENCOUNTER — Ambulatory Visit: Payer: Self-pay | Admitting: Family Medicine

## 2023-12-01 ENCOUNTER — Other Ambulatory Visit (HOSPITAL_COMMUNITY)
Admission: RE | Admit: 2023-12-01 | Discharge: 2023-12-01 | Disposition: A | Discharge status: Home / Self Care | Payer: Self-pay | Source: Ambulatory Visit | Attending: Family Medicine | Admitting: Family Medicine

## 2023-12-01 VITALS — BP 162/102 | HR 85 | Ht 71.0 in | Wt 162.4 lb

## 2023-12-01 DIAGNOSIS — Z113 Encounter for screening for infections with a predominantly sexual mode of transmission: Secondary | ICD-10-CM

## 2023-12-01 NOTE — Patient Instructions (Signed)
 It was great to see you today! Thank you for choosing [facility] for your primary care. Walter Salinas was seen for STD testing.  Today we addressed: STD testing - we checked your urine. The lab is closed for your blood test, you can come back to get your HIV and RPR checked.   Will follow up on your results through MyChart.   You should return to our clinic No follow-ups on file. Please arrive 15 minutes before your appointment to ensure smooth check in process.  We appreciate your efforts in making this happen.  Thank you for allowing me to participate in your care, Kathrine Melena, DO 12/01/2023, 4:54 PM PGY-1, [facility]

## 2023-12-05 LAB — URINE CYTOLOGY ANCILLARY ONLY
Chlamydia: NEGATIVE
Comment: NEGATIVE
Comment: NEGATIVE
Comment: NORMAL
Neisseria Gonorrhea: NEGATIVE
Trichomonas: NEGATIVE

## 2024-01-05 ENCOUNTER — Emergency Department (HOSPITAL_BASED_OUTPATIENT_CLINIC_OR_DEPARTMENT_OTHER)
Admission: EM | Admit: 2024-01-05 | Discharge: 2024-01-05 | Disposition: A | Discharge status: Home / Self Care | Payer: Self-pay | Attending: Emergency Medicine | Admitting: Emergency Medicine

## 2024-01-05 ENCOUNTER — Other Ambulatory Visit: Payer: Self-pay

## 2024-01-05 ENCOUNTER — Encounter (HOSPITAL_BASED_OUTPATIENT_CLINIC_OR_DEPARTMENT_OTHER): Payer: Self-pay | Admitting: Emergency Medicine

## 2024-01-05 DIAGNOSIS — I10 Essential (primary) hypertension: Secondary | ICD-10-CM | POA: Insufficient documentation

## 2024-01-05 DIAGNOSIS — R369 Urethral discharge, unspecified: Secondary | ICD-10-CM | POA: Insufficient documentation

## 2024-01-05 DIAGNOSIS — Z79899 Other long term (current) drug therapy: Secondary | ICD-10-CM | POA: Insufficient documentation

## 2024-01-05 LAB — URINALYSIS, ROUTINE W REFLEX MICROSCOPIC
Bilirubin Urine: NEGATIVE
Glucose, UA: NEGATIVE mg/dL
Hgb urine dipstick: NEGATIVE
Ketones, ur: NEGATIVE mg/dL
Leukocytes,Ua: NEGATIVE
Nitrite: NEGATIVE
Protein, ur: NEGATIVE mg/dL
Specific Gravity, Urine: 1.015 (ref 1.005–1.030)
pH: 5.5 (ref 5.0–8.0)

## 2024-01-05 LAB — HIV ANTIBODY (ROUTINE TESTING W REFLEX): HIV Screen 4th Generation wRfx: NONREACTIVE

## 2024-01-05 MED ORDER — CEFTRIAXONE SODIUM 500 MG IJ SOLR
500.0000 mg | Freq: Once | INTRAMUSCULAR | Status: AC
  Administered 2024-01-05: 500 mg via INTRAMUSCULAR
  Filled 2024-01-05: qty 500

## 2024-01-05 NOTE — ED Notes (Signed)
 Pt endorsed concern for STD exposure and has discharge with pain.

## 2024-01-05 NOTE — ED Triage Notes (Signed)
 Pt states he wants a std check.  He has been having intermittent hot sensation to his testicles.

## 2024-01-05 NOTE — Discharge Instructions (Addendum)
 You have been screened for potential sexually transmitted infection.  You may check your results through MyChart, link below. You were given an antibiotic in the ER to cover for potential infection.  If you test positive for infection please notify your partner to get tested as well.

## 2024-01-05 NOTE — ED Provider Notes (Signed)
 [place] EMERGENCY DEPARTMENT AT [facility] Provider Note   CSN: [id number] Arrival date & time: 01/05/24  9050     History  Chief Complaint  Patient presents with   Penile Discharge    Walter Salinas is a 33 y.o. male.  The history is provided by the patient and medical records. No language interpreter was used.  Penile Discharge     33 year old male history of hypertension prior history of STI presenting requesting for STI check.  Patient states he has had history of chlamydia infection in the past.  For the past month he has noticed occasional tingling sensation to the tip of his penis after sexual activities.  On occasion he will felt a burning sensation to his scrotal area.  He does not endorse any pain with sexual activities denies any penile discharge or rash.  His partner has not had any symptoms.  Patient however voices concern for potential STI.  States that he was diagnosed with chlamydia infection in the past and was given medication but was not fully compliant with it and he worries that it may still lingers.  He denies having new sexual partner  Home Medications Prior to Admission medications   Medication Sig Start Date End Date Taking? Authorizing Provider  irbesartan  (AVAPRO ) 75 MG tablet Take 1 tablet (75 mg total) by mouth daily. 08/30/23   Rosalynn Camie CROME, MD      Allergies    Patient has no known allergies.    Review of Systems   Review of Systems  Constitutional:  Negative for fever.  Genitourinary:  Positive for penile discharge.    Physical Exam Updated Vital Signs BP (!) 158/117 (BP Location: Left Arm)   Pulse 100   Temp 97.7 F (36.5 C) (Oral)   Resp 18   Ht 5' 11 (1.803 m)   Wt 70.3 kg   SpO2 100%   BMI 21.62 kg/m  Physical Exam Vitals and nursing note reviewed.  Constitutional:      General: He is not in acute distress.    Appearance: He is well-developed.  HENT:     Head: Atraumatic.  Eyes:     Conjunctiva/sclera:  Conjunctivae normal.  Abdominal:     Palpations: Abdomen is soft.     Tenderness: There is no abdominal tenderness.  Genitourinary:    Comments: Chaperone present during exam.  No inguinal lymphopathy or inguinal hernia noted.  Normal circumcised penis free of lesion or rash, normal scrotum, no concerning rash, perineal area is soft and nontender Musculoskeletal:     Cervical back: Neck supple.  Skin:    Findings: No rash.  Neurological:     Mental Status: He is alert.     ED Results / Procedures / Treatments   Labs (all labs ordered are listed, but only abnormal results are displayed) Labs Reviewed  URINALYSIS, ROUTINE W REFLEX MICROSCOPIC  RPR  HIV ANTIBODY (ROUTINE TESTING W REFLEX)  GC/CHLAMYDIA PROBE AMP ([place]) NOT AT [facility]    EKG None  Radiology No results found.  Procedures Procedures    Medications Ordered in ED Medications - No data to display  ED Course/ Medical Decision Making/ A&P                                 Medical Decision Making Amount and/or Complexity of Data Reviewed Labs: ordered.   BP (!) 158/117 (BP Location: Left Arm)  Pulse 100   Temp 97.7 F (36.5 C) (Oral)   Resp 18   Ht 5' 11 (1.803 m)   Wt 70.3 kg   SpO2 100%   BMI 21.62 kg/m   41:25 AM  33 year old male history of hypertension prior history of STI presenting requesting for STI check.  Patient states he has had history of chlamydia infection in the past.  For the past month he has noticed occasional tingling sensation to the tip of his penis after sexual activities.  On occasion he will felt a burning sensation to his scrotal area.  He does not endorse any pain with sexual activities denies any penile discharge or rash.  His partner has not had any symptoms.  Patient however voices concern for potential STI.  States that he was diagnosed with chlamydia infection in the past and was given medication but was not fully compliant with it and he worries that it may still  lingers.  He denies having new sexual partner  Genital exam with chaperone present without any concerning feature however given patient concerns of potential STI, will perform STI screening, and will give Rocephin  IM prophylactically while waiting for the results.  No concerning herpetic lesions.        Final Clinical Impression(s) / ED Diagnoses Final diagnoses:  Penile discharge    Rx / DC Orders ED Discharge Orders     None         Nivia Colon, PA-C 01/05/24 1144    Franklyn Sid SAILOR, MD 01/05/24 [phone]

## 2024-01-05 NOTE — ED Notes (Signed)

## 2024-01-06 LAB — RPR: RPR Ser Ql: NONREACTIVE

## 2024-01-08 LAB — GC/CHLAMYDIA PROBE AMP (~~LOC~~) NOT AT ARMC
Chlamydia: NEGATIVE
Comment: NEGATIVE
Comment: NORMAL
Neisseria Gonorrhea: NEGATIVE

## 2024-05-28 ENCOUNTER — Emergency Department (HOSPITAL_BASED_OUTPATIENT_CLINIC_OR_DEPARTMENT_OTHER)
Admission: EM | Admit: 2024-05-28 | Discharge: 2024-05-28 | Disposition: A | Discharge status: Home / Self Care | Attending: Emergency Medicine | Admitting: Emergency Medicine

## 2024-05-28 ENCOUNTER — Other Ambulatory Visit: Payer: Self-pay

## 2024-05-28 ENCOUNTER — Encounter (HOSPITAL_BASED_OUTPATIENT_CLINIC_OR_DEPARTMENT_OTHER): Payer: Self-pay

## 2024-05-28 DIAGNOSIS — K649 Unspecified hemorrhoids: Secondary | ICD-10-CM | POA: Diagnosis present

## 2024-05-28 DIAGNOSIS — R1030 Lower abdominal pain, unspecified: Secondary | ICD-10-CM | POA: Diagnosis not present

## 2024-05-28 DIAGNOSIS — K640 First degree hemorrhoids: Secondary | ICD-10-CM | POA: Insufficient documentation

## 2024-05-28 DIAGNOSIS — R309 Painful micturition, unspecified: Secondary | ICD-10-CM | POA: Insufficient documentation

## 2024-05-28 MED ORDER — SULFAMETHOXAZOLE-TRIMETHOPRIM 800-160 MG PO TABS: 1.0000 | ORAL_TABLET | Freq: Two times a day (BID) | ORAL | 0 refills | Status: AC

## 2024-05-28 MED ORDER — LIDOCAINE-EPINEPHRINE 1 %-1:100000 IJ SOLN
10.0000 mL | Freq: Once | INTRAMUSCULAR | Status: AC
  Administered 2024-05-28: 1 mL via INTRADERMAL
  Filled 2024-05-28: qty 1

## 2024-05-28 MED ORDER — AZITHROMYCIN 250 MG PO TABS
1000.0000 mg | ORAL_TABLET | Freq: Once | ORAL | Status: AC
  Administered 2024-05-28: 1000 mg via ORAL
  Filled 2024-05-28: qty 4

## 2024-05-28 MED ORDER — METRONIDAZOLE 500 MG PO TABS
2000.0000 mg | ORAL_TABLET | Freq: Once | ORAL | Status: AC
  Administered 2024-05-28: 2000 mg via ORAL
  Filled 2024-05-28: qty 4

## 2024-05-28 MED ORDER — CEFTRIAXONE SODIUM 500 MG IJ SOLR
500.0000 mg | Freq: Once | INTRAMUSCULAR | Status: AC
  Administered 2024-05-28: 500 mg via INTRAMUSCULAR
  Filled 2024-05-28: qty 500

## 2024-05-28 NOTE — ED Provider Notes (Signed)
 [place] [facility] Provider Note   CSN: [id number] Arrival date & time: 05/28/24  9196     Patient presents with: Groin Pain   Walter Salinas is a 33 y.o. male.   33 yo M with a chief complaints of hemorrhoids.  Patient said that he has had hemorrhoids for a long time.  He thinks he developed them in prison.  He thinks it was due to the toilets being all metal.  About 2 days ago he had some red stool.  He thinks maybe it was bloody.  He had had a very spicy meal that his girlfriend had made him before.  He is also been having pain that starts in his rectum and radiates to his penis and testicles.  Has been off and on for some time but he had sharp and severe pain while he was having intercourse about 5 days ago.  He denies any fevers or chills.  Does have some burning with urination at times.  Denies any rash denies discharge.   Groin Pain       Prior to Admission medications   Medication Sig Start Date End Date Taking? Authorizing Provider  sulfamethoxazole-trimethoprim (BACTRIM DS) 800-160 MG tablet Take 1 tablet by mouth 2 (two) times daily for 14 days. 05/28/24 06/11/24 Yes Emil Share, DO  irbesartan  (AVAPRO ) 75 MG tablet Take 1 tablet (75 mg total) by mouth daily. 08/30/23   Rosalynn Camie CROME, MD    Allergies: Patient has no known allergies.    Review of Systems  Updated Vital Signs BP (!) 158/114 (BP Location: Right Arm)   Pulse 88   Temp 98.8 F (37.1 C) (Oral)   Resp 18   Ht 5' 11 (1.803 m)   Wt 70.3 kg   SpO2 100%   BMI 21.62 kg/m   Physical Exam Vitals and nursing note reviewed. Exam conducted with a chaperone present.  Constitutional:      Appearance: He is well-developed.  HENT:     Head: Normocephalic and atraumatic.   Eyes:     Pupils: Pupils are equal, round, and reactive to light.   Neck:     Vascular: No JVD.   Cardiovascular:     Rate and Rhythm: Normal rate and regular rhythm.     Heart sounds: No murmur  heard.    No friction rub. No gallop.  Pulmonary:     Effort: No respiratory distress.     Breath sounds: No wheezing.  Abdominal:     General: There is no distension.     Tenderness: There is no abdominal tenderness. There is no guarding or rebound.  Genitourinary:    Comments: Circumcised.  No obvious rash to the genital area.  No obvious testicular pain or masses.  No lymphadenopathy.  Patient does have some fairly large hemorrhoids.  Nonthrombosed, no obvious bleeding.  Musculoskeletal:        General: Normal range of motion.     Cervical back: Normal range of motion and neck supple.   Skin:    Coloration: Skin is not pale.     Findings: No rash.   Neurological:     Mental Status: He is alert and oriented to person, place, and time.   Psychiatric:        Behavior: Behavior normal.     (all labs ordered are listed, but only abnormal results are displayed) Labs Reviewed  GC/CHLAMYDIA PROBE AMP ([place]) NOT AT [facility]  EKG: None  Radiology: No results found.   Procedures   Medications Ordered in the ED  cefTRIAXone  (ROCEPHIN ) injection 500 mg (has no administration in time range)  azithromycin  (ZITHROMAX ) tablet 1,000 mg (has no administration in time range)  metroNIDAZOLE  (FLAGYL ) tablet 2,000 mg (has no administration in time range)                                    Medical Decision Making Risk Prescription drug management.   33 yo M with a chief complaints of hemorrhoids as well as pain that shoots from his rectum to his penis and testicles.  Both of these problems have been ongoing for some time.  He had some worsening of his rectal pain about 5 days ago during intercourse.  I think most likely the patient has pain and spasm due to constipation or due to the hemorrhoids.  There is also the possibility of the patient has prostatitis.  He does have a history of gonorrhea.  Will send off a GC swab.  Will treat here for gonorrhea and chlamydia.   2-week course of antibiotics for possible prostatitis.  Has had ongoing symptoms we will give him information to follow-up with urology as well.  PCP follow-up.  8:36 AM:  I have discussed the diagnosis/risks/treatment options with the patient.  Evaluation and diagnostic testing in the emergency department does not suggest an emergent condition requiring admission or immediate intervention beyond what has been performed at this time.  They will follow up with PCP. We also discussed returning to the ED immediately if new or worsening sx occur. We discussed the sx which are most concerning (e.g., sudden worsening pain, fever, inability to tolerate by mouth) that necessitate immediate return. Medications administered to the patient during their visit and any new prescriptions provided to the patient are listed below.  Medications given during this visit Medications  cefTRIAXone  (ROCEPHIN ) injection 500 mg (has no administration in time range)  azithromycin  (ZITHROMAX ) tablet 1,000 mg (has no administration in time range)  metroNIDAZOLE  (FLAGYL ) tablet 2,000 mg (has no administration in time range)     The patient appears reasonably screen and/or stabilized for discharge and I doubt any other medical condition or other [facility] requiring further screening, evaluation, or treatment in the ED at this time prior to discharge.       Final diagnoses:  Grade I hemorrhoids    ED Discharge Orders          Ordered    sulfamethoxazole-trimethoprim (BACTRIM DS) 800-160 MG tablet  2 times daily        05/28/24 0831               Kattleya Kuhnert, DO 05/28/24 [phone]

## 2024-05-28 NOTE — ED Triage Notes (Addendum)
 States has had hx of hemorrhoids. Has had blood in stools 2 days ago. States was having intercourse 5 days ago and started having pain from rectum around to testicles and penis. Also c/o tingling to tip of penis. Denies discharge or painful urination. Lower abdominal pain and groin pain intermittently.  Adds he hasn't been taking his antihypertensives in a week

## 2024-05-28 NOTE — Discharge Instructions (Signed)
 Try to limit your time on the toilet.  I would start taking a stool softener and then if need be you can start a laxative.  Usually would have you take MiraLAX.  You can use a over-the-counter medication topically on your hemorrhoids.  You should do this after every bowel movement and before you go to bed.  I am covering with antibiotics for possible infection.  I have given you information to follow-up with urologist as you have had ongoing symptoms.

## 2024-05-29 LAB — GC/CHLAMYDIA PROBE AMP (~~LOC~~) NOT AT ARMC
Chlamydia: NEGATIVE
Comment: NEGATIVE
Comment: NORMAL
Neisseria Gonorrhea: NEGATIVE

## 2024-09-09 ENCOUNTER — Ambulatory Visit (INDEPENDENT_AMBULATORY_CARE_PROVIDER_SITE_OTHER): Admitting: Family Medicine

## 2024-09-09 VITALS — BP 152/104 | HR 72 | Ht 71.0 in | Wt 156.0 lb

## 2024-09-09 DIAGNOSIS — Z0000 Encounter for general adult medical examination without abnormal findings: Secondary | ICD-10-CM

## 2024-09-09 DIAGNOSIS — Z114 Encounter for screening for human immunodeficiency virus [HIV]: Secondary | ICD-10-CM

## 2024-09-09 DIAGNOSIS — I10 Essential (primary) hypertension: Secondary | ICD-10-CM | POA: Diagnosis not present

## 2024-09-09 DIAGNOSIS — N469 Male infertility, unspecified: Secondary | ICD-10-CM

## 2024-09-09 MED ORDER — IRBESARTAN 150 MG PO TABS: 150.0000 mg | ORAL_TABLET | Freq: Every day | ORAL | 0 refills | Status: AC

## 2024-09-09 NOTE — Progress Notes (Addendum)
" ° ° °  SUBJECTIVE:   CHIEF COMPLAINT / HPI: physical  Discussed the use of AI scribe software for clinical note transcription with the patient, who gave verbal consent to proceed.  History of Present Illness Walter Salinas is a 33 year old male with hypertension who presents for evaluation of kidney function, blood pressure, and fertility concerns.  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  Infertility and reproductive health - Attempting conception with partner for three years without success - History of childhood testicular injury with bleeding - No issues with ejaculation or erections - Partner has had two miscarriages and is on fertility medication - No prior discussion of fertility concerns with a physician - Requests sperm count evaluation and is unsure if this can be done through current provider  Preventive health screening - Requests HIV test as part of annual laboratory evaluation    PERTINENT  PMH / PSH: HTN, Sickle Cell Trait, Tobacco abuse  OBJECTIVE:   BP (!) 152/104   Pulse 72   Ht 5' 11 (1.803 m)   Wt 156 lb (70.8 kg)   SpO2 98%   BMI 21.76 kg/m   Physical Exam General: A&O, NAD HEENT: No sign of trauma, EOM grossly intact, moist mucous membranes Cardiac: RRR, no m/r/g Respiratory: CTAB, normal WOB, no w/c/r GI: Soft, NTTP, non-distended, no rebound or guarding Extremities: NTTP, no peripheral edema. Neuro: Moves all four extremities appropriately. Psych: Appropriate mood and affect    ASSESSMENT/PLAN:   Assessment & Plan Annual physical exam Screening for HIV (human immunodeficiency virus) Discussed routine health maintenance including vaccinations. Declined tetanus vaccine.  Explained tetanus risks and vaccination schedule. - Offer tetanus vaccine annually if he continues decline - Lipid panel today - CBC - HIV ordered today per patient request HYPERTENSION, BENIGN SYSTEMIC Uncontrolled. - Increase irbesartan  dose to 150 mg. - Monitor for dizziness and revert to 75 mg if symptoms occur. - BMP today - Recheck blood pressure in one month. 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 Partner has miscarriages but children from previous relationships. Reports remote testicular trauma, unlikely to cause this. - Testosterone  testing - Referral to reproductive endocrinology for sperm count   Return in about 2 weeks (around 09/23/2024) for bp, infertility.  Walter Provencal, MD, PGY-3 [facility] 8:12 AM 09/10/2024  [facility]   "

## 2024-09-09 NOTE — Assessment & Plan Note (Signed)
 Uncontrolled. - Increase irbesartan  dose to 150 mg. - Monitor for dizziness and revert to 75 mg if symptoms occur. - Recheck blood pressure in one month. - Repeat BMP 2 weeks

## 2024-09-09 NOTE — Patient Instructions (Addendum)
 It was great to see you! Thank you for allowing me to participate in your care!  Our plans for today:   VISIT SUMMARY: Today, we discussed your blood pressure management, fertility concerns, and general health maintenance. We also addressed your recent symptoms and occupational history.  YOUR PLAN: MALE INFERTILITY: You have been trying to conceive with your partner for three years without success.  -We will check your testosterone levels. - I have placed a referral to reproductive endocrinology for evaluation of your sperm; they should call you to schedule an appointment  ESSENTIAL HYPERTENSION: Your long-standing high blood pressure is currently managed with irbesartan , but your blood pressure was elevated today. -Increase your irbesartan  dose to 150 mg. -Monitor for dizziness. If you experience dizziness, revert to 75 mg. -Recheck your blood pressure in one month. -We will also check your kidney function and electrolytes in two weeks.  GENERAL HEALTH MAINTENANCE: We discussed routine health maintenance including vaccinations. -You declined the tetanus vaccine today. We recommend getting it annually to protect against tetanus.   Please arrive 15 minutes PRIOR to your next scheduled appointment time! If you do not, this affects OTHER patients' care.  Take care and seek immediate care sooner if you develop any concerns.   Ozell Provencal, MD, PGY-3 [place] 4:11 PM 09/09/2024  [facility]

## 2024-09-10 LAB — CBC WITH DIFFERENTIAL/PLATELET
Basophils Absolute: 0 x10E3/uL (ref 0.0–0.2)
Basos: 1 %
EOS (ABSOLUTE): 0.1 x10E3/uL (ref 0.0–0.4)
Eos: 2 %
Hematocrit: 44.7 % (ref 37.5–51.0)
Hemoglobin: 14.7 g/dL (ref 13.0–17.7)
Immature Grans (Abs): 0 x10E3/uL (ref 0.0–0.1)
Immature Granulocytes: 0 %
Lymphocytes Absolute: 1.7 x10E3/uL (ref 0.7–3.1)
Lymphs: 29 %
MCH: 29.8 pg (ref 26.6–33.0)
MCHC: 32.9 g/dL (ref 31.5–35.7)
MCV: 91 fL (ref 79–97)
Monocytes Absolute: 0.5 x10E3/uL (ref 0.1–0.9)
Monocytes: 8 %
Neutrophils Absolute: 3.5 x10E3/uL (ref 1.4–7.0)
Neutrophils: 60 %
Platelets: 145 x10E3/uL — ABNORMAL LOW (ref 150–450)
RBC: 4.93 x10E6/uL (ref 4.14–5.80)
RDW: 14.5 % (ref 11.6–15.4)
WBC: 5.8 x10E3/uL (ref 3.4–10.8)

## 2024-09-10 LAB — BASIC METABOLIC PANEL WITH GFR
BUN/Creatinine Ratio: 11 (ref 9–20)
BUN: 12 mg/dL (ref 6–20)
CO2: 23 mmol/L (ref 20–29)
Calcium: 9.5 mg/dL (ref 8.7–10.2)
Chloride: 103 mmol/L (ref 96–106)
Creatinine, Ser: 1.14 mg/dL (ref 0.76–1.27)
Glucose: 87 mg/dL (ref 70–99)
Potassium: 4.2 mmol/L (ref 3.5–5.2)
Sodium: 142 mmol/L (ref 134–144)
eGFR: 86 mL/min/1.73 (ref >59)

## 2024-09-10 LAB — LIPID PANEL
Chol/HDL Ratio: 2 ratio (ref 0.0–5.0)
Cholesterol, Total: 111 mg/dL (ref 100–199)
HDL: 55 mg/dL (ref >39)
LDL Chol Calc (NIH): 42 mg/dL (ref 0–99)
Triglycerides: 68 mg/dL (ref 0–149)
VLDL Cholesterol Cal: 14 mg/dL (ref 5–40)

## 2024-09-10 LAB — HIV ANTIBODY (ROUTINE TESTING W REFLEX): HIV Screen 4th Generation wRfx: NONREACTIVE

## 2024-09-10 LAB — TSH RFX ON ABNORMAL TO FREE T4: TSH: 3.81 u[IU]/mL (ref 0.450–4.500)

## 2024-09-13 LAB — TESTOSTERONE, FREE, TOTAL, SHBG
Sex Hormone Binding: 69.5 nmol/L — ABNORMAL HIGH (ref 16.5–55.9)
Testosterone: 938 ng/dL — ABNORMAL HIGH (ref 264–916)

## 2024-09-18 ENCOUNTER — Ambulatory Visit: Payer: Self-pay | Admitting: Family Medicine

## 2024-11-24 ENCOUNTER — Emergency Department (HOSPITAL_BASED_OUTPATIENT_CLINIC_OR_DEPARTMENT_OTHER)
Admission: EM | Admit: 2024-11-24 | Discharge: 2024-11-24 | Disposition: A | Discharge status: Home / Self Care | Attending: Emergency Medicine | Admitting: Emergency Medicine

## 2024-11-24 ENCOUNTER — Encounter (HOSPITAL_BASED_OUTPATIENT_CLINIC_OR_DEPARTMENT_OTHER): Payer: Self-pay | Admitting: Emergency Medicine

## 2024-11-24 ENCOUNTER — Other Ambulatory Visit: Payer: Self-pay

## 2024-11-24 ENCOUNTER — Emergency Department (HOSPITAL_BASED_OUTPATIENT_CLINIC_OR_DEPARTMENT_OTHER)

## 2024-11-24 DIAGNOSIS — R059 Cough, unspecified: Secondary | ICD-10-CM | POA: Diagnosis present

## 2024-11-24 DIAGNOSIS — J069 Acute upper respiratory infection, unspecified: Secondary | ICD-10-CM | POA: Insufficient documentation

## 2024-11-24 DIAGNOSIS — I10 Essential (primary) hypertension: Secondary | ICD-10-CM | POA: Insufficient documentation

## 2024-11-24 DIAGNOSIS — Z79899 Other long term (current) drug therapy: Secondary | ICD-10-CM | POA: Insufficient documentation

## 2024-11-24 DIAGNOSIS — J40 Bronchitis, not specified as acute or chronic: Secondary | ICD-10-CM | POA: Diagnosis not present

## 2024-11-24 LAB — RESP PANEL BY RT-PCR (RSV, FLU A&B, COVID)  RVPGX2
Influenza A by PCR: NEGATIVE
Influenza B by PCR: NEGATIVE
Resp Syncytial Virus by PCR: NEGATIVE
SARS Coronavirus 2 by RT PCR: NEGATIVE

## 2024-11-24 NOTE — ED Provider Notes (Signed)
 " [place] [facility] Provider Note   CSN: [id number] Arrival date & time: 11/24/24  9185     Patient presents with: Cough   Walter Salinas is a 33 y.o. male with a history of high blood pressure, sickle cell trait, smoking, presented to ED with complaint of cough and congestion and shortness of breath.  Patient reports that both of his kids had viral URI type syndromes earlier this month.  He himself had a few days of persistent cold and cough and fatigue.  Continues to complain of some night sweats, continues to have coughing and bringing up sputum, has some loose bowel movements as well.  He does smoke.  He is aware that he has high blood pressure and says that he recently restarted his blood pressure medication.   HPI     Prior to Admission medications  Medication Sig Start Date End Date Taking? Authorizing Provider  irbesartan  (AVAPRO ) 150 MG tablet Take 1 tablet (150 mg total) by mouth daily. 09/09/24 10/09/24  Quillen, Michael, MD    Allergies: Patient has no known allergies.    Review of Systems  Updated Vital Signs BP (!) 151/103 (BP Location: Left Arm)   Pulse 86   Temp 97.7 F (36.5 C)   Resp 18   Wt 72.6 kg   SpO2 99%   BMI 22.32 kg/m   Physical Exam Constitutional:      General: He is not in acute distress. HENT:     Head: Normocephalic and atraumatic.  Eyes:     Conjunctiva/sclera: Conjunctivae normal.     Pupils: Pupils are equal, round, and reactive to light.  Cardiovascular:     Rate and Rhythm: Normal rate and regular rhythm.  Pulmonary:     Effort: Pulmonary effort is normal. No respiratory distress.     Breath sounds: Normal breath sounds. No stridor. No rales.  Abdominal:     General: There is no distension.     Tenderness: There is no abdominal tenderness.  Skin:    General: Skin is warm and dry.  Neurological:     General: No focal deficit present.     Mental Status: He is alert. Mental status is  at baseline.  Psychiatric:        Mood and Affect: Mood normal.        Behavior: Behavior normal.     (all labs ordered are listed, but only abnormal results are displayed) Labs Reviewed  RESP PANEL BY RT-PCR (RSV, FLU A&B, COVID)  [id number]    EKG: None  Radiology: DG Chest 2 View Result Date: 11/24/2024 CLINICAL DATA:  Productive cough for 2 weeks.  Chills. EXAM: CHEST - 2 VIEW COMPARISON:  11/07/2018 FINDINGS: The heart size and mediastinal contours are within normal limits. Both lungs are clear. The visualized skeletal structures are unremarkable. IMPRESSION: No active cardiopulmonary disease. Electronically Signed   By: Norleen DELENA Kil M.D.   On: 11/24/2024 08:48     Procedures   Medications Ordered in the ED - No data to display                                  Medical Decision Making Amount and/or Complexity of Data Reviewed Radiology: ordered.   This is a well-appearing patient here with suspected viral type syndrome, potential bronchitis or sequelae of recent viral infection.  He is not wheezing and not in respiratory  distress.  I reviewed his x-ray which does not show any focal infiltrate or evidence of pneumonia or pneumothorax.  I doubt acute PE.  His flu and COVID testing and RSV testing are negative.  However I do suspect this is likely another virus that he acquired from his children, and explained that bronchitis can often last for a few weeks.  I do not see indication for antibiotics at this time.  I did recommend Mucinex  but he was she will buy over-the-counter.  Counseled him about his high blood pressure, which he is aware of and monitoring, as well about smoking cessation.  He is stable for discharge     Final diagnoses:  Viral URI  Bronchitis  Hypertension, unspecified type    ED Discharge Orders     None          Cottie Donnice PARAS, MD 11/24/24 [phone]  "

## 2024-11-24 NOTE — ED Triage Notes (Signed)
 Chills , cough , green sputum . Diarrhea .

## 2024-12-11 ENCOUNTER — Emergency Department (HOSPITAL_BASED_OUTPATIENT_CLINIC_OR_DEPARTMENT_OTHER): Admission: EM | Admit: 2024-12-11 | Discharge: 2024-12-11 | Discharge status: Against Medical Advice

## 2024-12-11 ENCOUNTER — Other Ambulatory Visit: Payer: Self-pay

## 2024-12-11 NOTE — ED Notes (Signed)
 Patient called for triage several times. Per patient access team, the patient ambulated out of waiting area prior to triage.
